# Patient Record
Sex: Female | Born: 1970 | Race: White | Hispanic: No | Smoking: Never smoker
Health system: Southern US, Community
[De-identification: ages and names within clinical notes are randomized; demographics above are authoritative.]

## PROBLEM LIST (undated history)

## (undated) DIAGNOSIS — J302 Other seasonal allergic rhinitis: Secondary | ICD-10-CM

## (undated) DIAGNOSIS — J45998 Other asthma: Secondary | ICD-10-CM

## (undated) DIAGNOSIS — M5136 Other intervertebral disc degeneration, lumbar region: Secondary | ICD-10-CM

## (undated) DIAGNOSIS — M5430 Sciatica, unspecified side: Secondary | ICD-10-CM

## (undated) DIAGNOSIS — M51369 Other intervertebral disc degeneration, lumbar region without mention of lumbar back pain or lower extremity pain: Secondary | ICD-10-CM

## (undated) DIAGNOSIS — F419 Anxiety disorder, unspecified: Secondary | ICD-10-CM

## (undated) DIAGNOSIS — G4730 Sleep apnea, unspecified: Secondary | ICD-10-CM

## (undated) DIAGNOSIS — I10 Essential (primary) hypertension: Secondary | ICD-10-CM

## (undated) DIAGNOSIS — T7840XA Allergy, unspecified, initial encounter: Secondary | ICD-10-CM

## (undated) HISTORY — PX: OTHER SURGICAL HISTORY: SHX169

## (undated) HISTORY — DX: Other intervertebral disc degeneration, lumbar region without mention of lumbar back pain or lower extremity pain: M51.369

## (undated) HISTORY — DX: Other seasonal allergic rhinitis: J30.2

## (undated) HISTORY — DX: Sleep apnea, unspecified: G47.30

## (undated) HISTORY — DX: Essential (primary) hypertension: I10

## (undated) HISTORY — PX: APPENDECTOMY: SHX54

## (undated) HISTORY — PX: FRACTURE SURGERY: SHX138

## (undated) HISTORY — DX: Sciatica, unspecified side: M54.30

## (undated) HISTORY — DX: Other intervertebral disc degeneration, lumbar region: M51.36

## (undated) HISTORY — DX: Allergy, unspecified, initial encounter: T78.40XA

## (undated) HISTORY — PX: JOINT REPLACEMENT: SHX530

## (undated) HISTORY — DX: Other asthma: J45.998

---

## 2015-02-23 DIAGNOSIS — G5603 Carpal tunnel syndrome, bilateral upper limbs: Secondary | ICD-10-CM | POA: Insufficient documentation

## 2015-08-22 ENCOUNTER — Encounter: Payer: Self-pay | Admitting: Emergency Medicine

## 2015-08-22 ENCOUNTER — Emergency Department (INDEPENDENT_AMBULATORY_CARE_PROVIDER_SITE_OTHER)
Admission: EM | Admit: 2015-08-22 | Discharge: 2015-08-22 | Disposition: A | Discharge status: Home / Self Care | Payer: PRIVATE HEALTH INSURANCE | Source: Home / Self Care | Attending: Family Medicine | Admitting: Family Medicine

## 2015-08-22 DIAGNOSIS — Z5189 Encounter for other specified aftercare: Secondary | ICD-10-CM | POA: Diagnosis not present

## 2015-08-22 NOTE — ED Notes (Signed)
Had carpal tunnel surgery left wrist 2 days ago; thinks a suture came out today during drsg change.

## 2015-08-22 NOTE — Discharge Instructions (Signed)
Your surgical wound looks to be healing well.  Please follow the instructions given to you by your hand surgeon. It is best to call him Monday to see if he wants to see you before your sutures have been scheduled to be removed.  Be sure to keep wound covered to help keep it protected and clean.

## 2015-08-22 NOTE — ED Provider Notes (Signed)
CSN: [id number]     Arrival date & time 08/22/15  2011 History   First MD Initiated Contact with Patient 08/22/15 2024     Chief Complaint  Patient presents with  . Wound Check   (Consider location/radiation/quality/duration/timing/severity/associated sxs/prior Treatment) HPI  Debra MorseMary Clodfelter is a 45 y.o. female presenting to UC with concern for removing a suture after taking off her bandages from carpal tunnel surgery 2 days ago on her Left wrist. She was advised to take the bandages off today and when she did, she noticed there was, what seemed to be, a lot of blood on the gauze. She is not sure how many sutures were in place to begin with.  Denies new injuries. Pain is minimal. Bleeding controlled PTA.  She is not on antibiotics. She is not on blood thinners. She believes she is to f/u on 6/26 for suture removal.  The surgeon's office was closed by the time she took the bandage off and noticed the bleeding.    History reviewed. No pertinent past medical history. Past Surgical History  Procedure Laterality Date  . Right foot surgery    . Right knee surgery     History reviewed. No pertinent family history. Social History  Substance Use Topics  . Smoking status: Never Smoker   . Smokeless tobacco: None  . Alcohol Use: Yes   OB History    No data available     Review of Systems  Musculoskeletal: Positive for arthralgias ( mild hand soreness). Negative for myalgias and joint swelling.  Skin: Positive for wound ( surgical, Left wrist/hand). Negative for color change.  Neurological: Negative for weakness and numbness.    Allergies  Review of patient's allergies indicates not on file.  Home Medications   Prior to Admission medications   Medication Sig Start Date End Date Taking? Authorizing Provider  escitalopram (LEXAPRO) 10 MG tablet Take 10 mg by mouth daily.   Yes Historical Provider, MD   Meds Ordered and Administered this Visit  Medications - No data to display  BP  152/85 mmHg  Pulse 68  Temp(Src) 97.9 F (36.6 C) (Oral)  Resp 16  Ht 5\' 10"  (1.778 m)  Wt 313 lb (141.976 kg)  BMI 44.91 kg/m2  SpO2 97%  LMP 08/04/2015 No data found.   Physical Exam  Constitutional: She is oriented to person, place, and time. She appears well-developed and well-nourished.  HENT:  Head: Normocephalic and atraumatic.  Eyes: EOM are normal.  Neck: Normal range of motion.  Cardiovascular: Normal rate.   Pulses:      Radial pulses are 2+ on the left side.  Left hand: cap refill < 3 seconds  Pulmonary/Chest: Effort normal.  Musculoskeletal: She exhibits edema and tenderness.  Left hand (see skin exam): mild edema around sutures where skin is pulled tight together. No tenderness. Limited ROM of hand due to tightness of skin and soreness. Pt concerned of pulling out sutures.  Neurological: She is alert and oriented to person, place, and time.  Skin: Skin is warm and dry.  Left hand: 2 sutures in place c/w carpal tunnel surgery. Small skin avulsion on distal aspect of incision (area pt concerned suture is missing) no deep wounds. No active bleeding. No erythema or warmth. No discharge.  Psychiatric: She has a normal mood and affect. Her behavior is normal.  Nursing note and vitals reviewed.   ED Course  Procedures (including critical care time)  Labs Review Labs Reviewed - No data to display  Imaging Review No results found.    MDM   1. Visit for wound check    Surgical wound appears to be healing well w/o signs of infection. Reassured pt it does not look as if any sutures are missing. Wound cleaned of dried blood. Bacitracin and light bandage applied. Encouraged to call surgeon on Monday to see if she needs to go in for a wound recheck prior to sutures being removed on 6/26.  Patient verbalized understanding and agreement with treatment plan.    Junius Finner, PA-C 08/22/15 2120

## 2015-10-31 DIAGNOSIS — Z9889 Other specified postprocedural states: Secondary | ICD-10-CM | POA: Insufficient documentation

## 2015-11-27 DIAGNOSIS — M2142 Flat foot [pes planus] (acquired), left foot: Secondary | ICD-10-CM | POA: Insufficient documentation

## 2016-01-22 DIAGNOSIS — M79672 Pain in left foot: Secondary | ICD-10-CM | POA: Insufficient documentation

## 2016-01-22 DIAGNOSIS — M19079 Primary osteoarthritis, unspecified ankle and foot: Secondary | ICD-10-CM | POA: Insufficient documentation

## 2017-07-04 DIAGNOSIS — G8929 Other chronic pain: Secondary | ICD-10-CM | POA: Insufficient documentation

## 2017-08-05 DIAGNOSIS — R6889 Other general symptoms and signs: Secondary | ICD-10-CM | POA: Insufficient documentation

## 2017-08-19 DIAGNOSIS — M1711 Unilateral primary osteoarthritis, right knee: Secondary | ICD-10-CM | POA: Insufficient documentation

## 2018-01-27 DIAGNOSIS — M1712 Unilateral primary osteoarthritis, left knee: Secondary | ICD-10-CM | POA: Insufficient documentation

## 2018-09-11 DIAGNOSIS — J45909 Unspecified asthma, uncomplicated: Secondary | ICD-10-CM | POA: Insufficient documentation

## 2018-09-12 DIAGNOSIS — M47816 Spondylosis without myelopathy or radiculopathy, lumbar region: Secondary | ICD-10-CM | POA: Insufficient documentation

## 2018-09-12 DIAGNOSIS — M5136 Other intervertebral disc degeneration, lumbar region: Secondary | ICD-10-CM | POA: Insufficient documentation

## 2019-06-20 ENCOUNTER — Encounter: Payer: Self-pay | Admitting: Emergency Medicine

## 2019-06-20 ENCOUNTER — Emergency Department (INDEPENDENT_AMBULATORY_CARE_PROVIDER_SITE_OTHER)
Admission: EM | Admit: 2019-06-20 | Discharge: 2019-06-20 | Disposition: A | Discharge status: Home / Self Care | Payer: PRIVATE HEALTH INSURANCE | Source: Home / Self Care | Attending: Family Medicine | Admitting: Family Medicine

## 2019-06-20 ENCOUNTER — Emergency Department (INDEPENDENT_AMBULATORY_CARE_PROVIDER_SITE_OTHER): Payer: PRIVATE HEALTH INSURANCE

## 2019-06-20 ENCOUNTER — Other Ambulatory Visit: Payer: Self-pay

## 2019-06-20 DIAGNOSIS — S6000XA Contusion of unspecified finger without damage to nail, initial encounter: Secondary | ICD-10-CM | POA: Diagnosis not present

## 2019-06-20 DIAGNOSIS — S62663A Nondisplaced fracture of distal phalanx of left middle finger, initial encounter for closed fracture: Secondary | ICD-10-CM

## 2019-06-20 DIAGNOSIS — S6010XA Contusion of unspecified finger with damage to nail, initial encounter: Secondary | ICD-10-CM

## 2019-06-20 NOTE — ED Triage Notes (Addendum)
Caught left middle finger in a cart at 0845 Blackened nail -bruising noted w/ swelling Pt has been icing

## 2019-06-20 NOTE — ED Provider Notes (Signed)
[name]    CSN: [id number] Arrival date & time: 06/20/19  1622      History   Chief Complaint Chief Complaint  Patient presents with  . Hand Injury    middle left finger    HPI Debra Nolan is a 49 y.o. female.   Patient caught her left middle finger in a cart about 9 hours ago resulting in pain of the tip of her finger.      Hand Pain This is a new problem. The current episode started 6 to 12 hours ago. The problem occurs constantly. The problem has not changed since onset.Exacerbated by: contact. Nothing relieves the symptoms. Treatments tried: ice packs. The treatment provided mild relief.    History reviewed. No pertinent past medical history.  There are no problems to display for this patient.   Past Surgical History:  Procedure Laterality Date  . right foot surgery    . right knee surgery      OB History   No obstetric history on file.      Home Medications    Prior to Admission medications   Medication Sig Start Date End Date Taking? Authorizing Provider  azelastine (ASTELIN) 0.1 % nasal spray Place into the nose. 09/23/15  Yes [provider]  citalopram (CELEXA) 20 MG tablet Take 20 mg by mouth daily. 06/14/19  Yes [provider]  gabapentin (NEURONTIN) 600 MG tablet Take 600 mg by mouth daily. 06/06/19  Yes [provider]  hydrochlorothiazide (HYDRODIURIL) 12.5 MG tablet Take 12.5 mg by mouth daily. 06/14/19  Yes [provider]  lisinopril (ZESTRIL) 10 MG tablet Take 10 mg by mouth daily. 06/14/19  Yes [provider]  albuterol (VENTOLIN HFA) 108 (90 Base) MCG/ACT inhaler Inhale into the lungs. 01/12/18   [provider]  escitalopram (LEXAPRO) 10 MG tablet Take 10 mg by mouth daily.    [provider]    Family History Family History  Problem Relation Age of Onset  . Healthy Mother   . Healthy Father   . Healthy Sister     Social History Social History   Tobacco  Use  . Smoking status: Never Smoker  . Smokeless tobacco: Never Used  Substance Use Topics  . Alcohol use: Yes  . Drug use: Not on file     Allergies   Patient has no known allergies.   Review of Systems Review of Systems  Skin: Positive for color change.  All other systems reviewed and are negative.    Physical Exam Triage Vital Signs ED Triage Vitals  Enc Vitals Group     BP 06/20/19 1659 (!) 155/88     Pulse Rate 06/20/19 1659 83     Resp 06/20/19 1659 16     Temp 06/20/19 1659 97.9 F (36.6 C)     Temp Source 06/20/19 1659 Oral     SpO2 06/20/19 1659 97 %     Weight 06/20/19 1705 (!) 325 lb (147.4 kg)     Height 06/20/19 1705 5\' 10"  (1.778 m)     Head Circumference --      Peak Flow --      Pain Score 06/20/19 1704 8     Pain Loc --      Pain Edu? --      Excl. in [place]? --    No data found.  Updated Vital Signs BP (!) 155/88 (BP Location: Right Arm)   Pulse 83   Temp 97.9 F (36.6  C) (Oral)   Resp 16   Ht 5\' 10"  (1.778 m)   Wt (!) 147.4 kg   LMP 06/10/2019 (Exact Date)   SpO2 97%   BMI 46.63 kg/m   Visual Acuity Right Eye Distance:   Left Eye Distance:   Bilateral Distance:    Right Eye Near:   Left Eye Near:    Bilateral Near:     Physical Exam Vitals and nursing note reviewed.  Constitutional:      General: She is not in acute distress. HENT:     Head: Atraumatic.  Eyes:     Pupils: Pupils are equal, round, and reactive to light.  Cardiovascular:     Rate and Rhythm: Normal rate.  Pulmonary:     Effort: Pulmonary effort is normal.  Musculoskeletal:     Left hand: Tenderness and bony tenderness present. There is no disruption of two-point discrimination.       Hands:     Comments: Distal phalanx of left third finger is tender to palpation with subungual hematoma present.  No deformity or laceration present.  Skin:    General: Skin is warm and dry.  Neurological:     Mental Status: She is alert.      UC Treatments / Results   Labs (all labs ordered are listed, but only abnormal results are displayed) Labs Reviewed - No data to display  EKG   Radiology DG Hand Complete Left  Result Date: 06/20/2019 CLINICAL DATA:  Left hand injury bruising to the middle finger EXAM: LEFT HAND - COMPLETE 3+ VIEW COMPARISON:  None. FINDINGS: There is comminuted nondisplaced fracture seen of the third distal tuft. Overlying soft tissue swelling is noted. There is no evidence of arthropathy or other focal bone abnormality. IMPRESSION: Comminuted nondisplaced third distal tuft fracture with overlying soft tissue swelling. Electronically Signed   By: 06/22/2019 M.D.   On: 06/20/2019 18:26    Procedures Procedures  Drainage of sub-ungual hematoma. Discussed benefits and risks of procedure and verbal consent obtained. Cleansed with Betadine followed by normal saline. Using an electrocautery device, quickly burned a 43mm hole in base of fingernail resulting in drainage of hematoma.  Bacitracin and sterile dressing applied.  Wound precautions explained to patient.     Medications Ordered in UC Medications - No data to display  Initial Impression / Assessment and Plan / UC Course  I have reviewed the triage vital signs and the nursing notes.  Pertinent labs & imaging results that were available during my care of the patient were reviewed by me and considered in my medical decision making (see chart for details).    Splint applied. Return for worsening symptoms.  Final Clinical Impressions(s) / UC Diagnoses   Final diagnoses:  Contusion of fingertip, initial encounter  Closed nondisplaced fracture of distal phalanx of left middle finger, initial encounter  Subungual hematoma of finger of left hand, initial encounter     Discharge Instructions     Change bandage daily and apply Bacitracin ointment to fingernail.  Keep bandage clean and dry.  Return for any signs of infection (or follow-up with family doctor):  Increasing  redness, swelling, pain, heat, drainage, etc.  Wear splint as needed.       ED Prescriptions    None        0m, MD 06/25/19 [id number]

## 2019-06-20 NOTE — Discharge Instructions (Addendum)
Change bandage daily and apply Bacitracin ointment to fingernail.  Keep bandage clean and dry.  Return for any signs of infection (or follow-up with family doctor):  Increasing redness, swelling, pain, heat, drainage, etc.  Wear splint as needed.

## 2019-06-21 ENCOUNTER — Telehealth: Payer: Self-pay | Admitting: Family Medicine

## 2019-06-21 MED ORDER — HYDROCODONE-ACETAMINOPHEN 5-325 MG PO TABS: ORAL_TABLET | ORAL | 0 refills | Status: DC

## 2019-06-21 NOTE — Telephone Encounter (Signed)
Subjective:  Patient complains of pain in finger at night. PLAN: Rx for Vicodin at bedtime prn (#5, no refill).

## 2019-07-01 ENCOUNTER — Emergency Department (INDEPENDENT_AMBULATORY_CARE_PROVIDER_SITE_OTHER)
Admission: EM | Admit: 2019-07-01 | Discharge: 2019-07-01 | Disposition: A | Discharge status: Home / Self Care | Payer: PRIVATE HEALTH INSURANCE | Source: Home / Self Care | Attending: Family Medicine | Admitting: Family Medicine

## 2019-07-01 ENCOUNTER — Encounter: Payer: Self-pay | Admitting: Emergency Medicine

## 2019-07-01 ENCOUNTER — Other Ambulatory Visit: Payer: Self-pay

## 2019-07-01 DIAGNOSIS — Z5189 Encounter for other specified aftercare: Secondary | ICD-10-CM

## 2019-07-01 NOTE — ED Provider Notes (Signed)
[name]    CSN: [id number] Arrival date & time: 07/01/19  8338      History   Chief Complaint Chief Complaint  Patient presents with  . Wound Check    HPI Debra Nolan is a 49 y.o. female.   Patient returns for follow-up of left third fingertip injury.  She reports mild tenderness but no erythema, swelling, or drainage from fingernail.  The history is provided by the patient.    History reviewed. No pertinent past medical history.  There are no problems to display for this patient.   Past Surgical History:  Procedure Laterality Date  . right foot surgery    . right knee surgery      OB History   No obstetric history on file.      Home Medications    Prior to Admission medications   Medication Sig Start Date End Date Taking? Authorizing Provider  albuterol (VENTOLIN HFA) 108 (90 Base) MCG/ACT inhaler Inhale into the lungs. 01/12/18   [provider]  azelastine (ASTELIN) 0.1 % nasal spray Place into the nose. 09/23/15   [provider]  citalopram (CELEXA) 20 MG tablet Take 20 mg by mouth daily. 06/14/19   [provider]  escitalopram (LEXAPRO) 10 MG tablet Take 10 mg by mouth daily.    [provider]  gabapentin (NEURONTIN) 600 MG tablet Take 600 mg by mouth daily. 06/06/19   [provider]  hydrochlorothiazide (HYDRODIURIL) 12.5 MG tablet Take 12.5 mg by mouth daily. 06/14/19   [provider]  HYDROcodone-acetaminophen (NORCO/VICODIN) 5-325 MG tablet Take one by mouth at bedtime as needed for pain 06/21/19   Kandra Nicolas, MD  lisinopril (ZESTRIL) 10 MG tablet Take 10 mg by mouth daily. 06/14/19   [provider]    Family History Family History  Problem Relation Age of Onset  . Healthy Mother   . Healthy Father   . Healthy Sister     Social History Social History   Tobacco Use  . Smoking status: Never Smoker  . Smokeless tobacco: Never Used  Substance Use Topics  .  Alcohol use: Yes  . Drug use: Not on file     Allergies   Patient has no known allergies.   Review of Systems Review of Systems  Constitutional: Negative for chills, diaphoresis, fatigue and fever.  Musculoskeletal: Negative for joint swelling.  Skin: Negative for color change and rash.     Physical Exam Triage Vital Signs ED Triage Vitals  Enc Vitals Group     BP 07/01/19 0949 139/80     Pulse Rate 07/01/19 0949 89     Resp 07/01/19 0949 16     Temp 07/01/19 0949 98.5 F (36.9 C)     Temp Source 07/01/19 0949 Oral     SpO2 07/01/19 0949 97 %     Weight --      Height --      Head Circumference --      Peak Flow --      Pain Score 07/01/19 0950 1     Pain Loc --      Pain Edu? --      Excl. in [place]? --    No data found.  Updated Vital Signs BP 139/80 (BP Location: Right Wrist)   Pulse 89   Temp 98.5 F (36.9 C) (Oral)   Resp 16   LMP 06/10/2019 (Exact Date)   SpO2 97%   Visual Acuity Right Eye Distance:  Left Eye Distance:   Bilateral Distance:    Right Eye Near:   Left Eye Near:    Bilateral Near:     Physical Exam Vitals and nursing note reviewed.  Constitutional:      General: She is not in acute distress. Cardiovascular:     Rate and Rhythm: Normal rate.     Pulses: Normal pulses.  Musculoskeletal:     Left hand: Normal.     Comments: Left finger distal phalanx:  Minimal tenderness to palpation.  Resolving sub-ungual hematoma.  No erythema, swelling, or drainage.  Skin:    General: Skin is warm and dry.     Findings: No rash.  Neurological:     Mental Status: She is alert.      UC Treatments / Results  Labs (all labs ordered are listed, but only abnormal results are displayed) Labs Reviewed - No data to display  EKG   Radiology No results found.  Procedures Procedures (including critical care time)  Medications Ordered in UC Medications - No data to display  Initial Impression / Assessment and Plan / UC Course  I have  reviewed the triage vital signs and the nursing notes.  Pertinent labs & imaging results that were available during my care of the patient were reviewed by me and considered in my medical decision making (see chart for details).    History of fingertip fracture.  Fingertip shows no evidence cellulitis.   Final Clinical Impressions(s) / UC Diagnoses   Final diagnoses:  Visit for wound check     Discharge Instructions     Change bandage daily and apply Bacitracin ointment to fingernail.  Keep fingertip clean and dry.  Wear finger splint as needed.  Return for any signs of infection (or follow-up with family doctor):  Increasing redness, swelling, pain, heat, drainage, etc.      ED Prescriptions    None        Lattie Haw, MD 07/01/19 [phone]

## 2019-07-01 NOTE — ED Triage Notes (Signed)
Patient here for wound check middle fingertip of left hand. Wearing finger protector because still tender to touch.

## 2019-07-01 NOTE — Discharge Instructions (Addendum)
Change bandage daily and apply Bacitracin ointment to fingernail.  Keep fingertip clean and dry.  Wear finger splint as needed.  Return for any signs of infection (or follow-up with family doctor):  Increasing redness, swelling, pain, heat, drainage, etc.

## 2019-10-22 ENCOUNTER — Other Ambulatory Visit: Payer: Self-pay

## 2019-10-22 ENCOUNTER — Emergency Department (INDEPENDENT_AMBULATORY_CARE_PROVIDER_SITE_OTHER): Payer: PRIVATE HEALTH INSURANCE

## 2019-10-22 ENCOUNTER — Encounter: Payer: Self-pay | Admitting: Emergency Medicine

## 2019-10-22 ENCOUNTER — Emergency Department (INDEPENDENT_AMBULATORY_CARE_PROVIDER_SITE_OTHER)
Admission: EM | Admit: 2019-10-22 | Discharge: 2019-10-22 | Disposition: A | Discharge status: Home / Self Care | Payer: PRIVATE HEALTH INSURANCE | Source: Home / Self Care

## 2019-10-22 DIAGNOSIS — M79672 Pain in left foot: Secondary | ICD-10-CM | POA: Diagnosis not present

## 2019-10-22 NOTE — Discharge Instructions (Signed)
°  You may wear the boot for comfort. Remove to bath or when elevating and applying cool compress for comfort. You may take tylenol and motrin for pain.  Call to schedule a follow up appointment with your previously established orthopedist for ongoing treatment of your pain.

## 2019-10-22 NOTE — ED Triage Notes (Signed)
Lt foot pain x 5 days, denies injury

## 2019-10-22 NOTE — ED Provider Notes (Signed)
[facility]    CSN: [id number] Arrival date & time: 10/22/19  0816      History   Chief Complaint Chief Complaint  Patient presents with  . Foot Pain    HPI Debra Nolan is a 49 y.o. female.   HPI Debra Nolan is a 49 y.o. female presenting to UC with c/o Left foot pain along lateral aspect for about 5 days, pain is aching and sore, 6/10, worse with weightbearing.  No known injury.  She did have surgery on Right foot about 8-10 years ago and right knee.  She has been on her feet more recently. No medication taken PTA.   No past medical history on file.  There are no problems to display for this patient.   Past Surgical History:  Procedure Laterality Date  . right foot surgery    . right knee surgery      OB History   No obstetric history on file.      Home Medications    Prior to Admission medications   Medication Sig Start Date End Date Taking? Authorizing Provider  albuterol (VENTOLIN HFA) 108 (90 Base) MCG/ACT inhaler Inhale into the lungs. 01/12/18   [provider]  azelastine (ASTELIN) 0.1 % nasal spray Place into the nose. 09/23/15   [provider]  escitalopram (LEXAPRO) 10 MG tablet Take 10 mg by mouth daily.    [provider]  gabapentin (NEURONTIN) 600 MG tablet Take 600 mg by mouth daily. 06/06/19   [provider]  hydrochlorothiazide (HYDRODIURIL) 12.5 MG tablet Take 12.5 mg by mouth daily. 06/14/19   [provider]  lisinopril (ZESTRIL) 10 MG tablet Take 10 mg by mouth daily. 06/14/19   [provider]    Family History Family History  Problem Relation Age of Onset  . Healthy Mother   . Healthy Father   . Healthy Sister     Social History Social History   Tobacco Use  . Smoking status: Never Smoker  . Smokeless tobacco: Never Used  Vaping Use  . Vaping Use: Never used  Substance Use Topics  . Alcohol use: Yes  . Drug use: Not on file     Allergies   Patient has no  known allergies.   Review of Systems Review of Systems  Musculoskeletal: Positive for arthralgias, gait problem (pain in left foot) and myalgias.  Skin: Negative for color change and wound.     Physical Exam Triage Vital Signs ED Triage Vitals  Enc Vitals Group     BP 10/22/19 0837 132/88     Pulse Rate 10/22/19 0837 78     Resp 10/22/19 0837 20     Temp 10/22/19 0837 98.2 F (36.8 C)     Temp Source 10/22/19 0837 Oral     SpO2 10/22/19 0837 96 %     Weight 10/22/19 0838 (!) 324 lb (147 kg)     Height 10/22/19 0838 5\' 10"  (1.778 m)     Head Circumference --      Peak Flow --      Pain Score 10/22/19 0838 6     Pain Loc --      Pain Edu? --      Excl. in [place]? --    No data found.  Updated Vital Signs BP 132/88 (BP Location: Right Arm)   Pulse 78   Temp 98.2 F (36.8 C) (Oral)   Resp 20   Ht 5\' 10"  (1.778 m)   Wt 10/24/19)  324 lb (147 kg)   SpO2 96%   BMI 46.49 kg/m   Visual Acuity Right Eye Distance:   Left Eye Distance:   Bilateral Distance:    Right Eye Near:   Left Eye Near:    Bilateral Near:     Physical Exam Vitals and nursing note reviewed.  Constitutional:      Appearance: She is well-developed.  HENT:     Head: Normocephalic and atraumatic.  Cardiovascular:     Rate and Rhythm: Normal rate.  Pulmonary:     Effort: Pulmonary effort is normal.  Musculoskeletal:        General: Swelling and tenderness present. Normal range of motion.     Cervical back: Normal range of motion.       Feet:  Feet:     Comments: Full ROM Left ankle and toes.  Skin:    General: Skin is warm and dry.     Capillary Refill: Capillary refill takes less than 2 seconds.     Findings: No bruising or erythema.  Neurological:     Mental Status: She is alert and oriented to person, place, and time.     Sensory: No sensory deficit.  Psychiatric:        Behavior: Behavior normal.      UC Treatments / Results  Labs (all labs ordered are listed, but only abnormal  results are displayed) Labs Reviewed - No data to display  EKG   Radiology DG Foot Complete Left  Result Date: 10/22/2019 CLINICAL DATA:  Lateral foot pain since Thursday.  No injury. EXAM: LEFT FOOT - COMPLETE 3+ VIEW COMPARISON:  None. FINDINGS: Evidence of prior Lisfranc injury with lateral subluxation of the second metatarsal with respect to the middle cuneiform and prominent bony hypertrophy between the bases of the first and second metatarsals. 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 Old healed fracture of the second metatarsal shaft. Advanced TMT joint osteoarthritis. Mild pes planus. Bone mineralization is normal. Soft tissues are unremarkable. IMPRESSION: 1.  No acute osseous abnormality. 2. 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 Correlate for history of prior trauma. Neuropathic arthropathy could have a similar appearance. 3. Advanced TMT joint osteoarthritis. Electronically Signed   By: Obie Dredge M.D.   On: 10/22/2019 09:32    Procedures Procedures (including critical care time)  Medications Ordered in UC Medications - No data to display  Initial Impression / Assessment and Plan / UC Course  I have reviewed the triage vital signs and the nursing notes.  Pertinent labs & imaging results that were available during my care of the patient were reviewed by me and considered in my medical decision making (see chart for details).     Discussed imaging with pt Pt placed in short walking boot for comfort.   Encouraged to schedule f/u with previously established orthopedist for further evaluation and ongoing treatment of Left foot pain. AVS given.  Final Clinical Impressions(s) / UC Diagnoses   Final diagnoses:  Acute foot pain, left     Discharge Instructions      You may wear the boot for comfort. Remove to bath or when elevating and applying cool compress for comfort. You may take tylenol and motrin for pain.  Call to  schedule a follow up appointment with your previously established orthopedist for ongoing treatment of your pain.     ED Prescriptions    None     I have reviewed the PDMP during this encounter.  Lurene Shadow, PA-C 10/22/19 1037

## 2019-11-23 ENCOUNTER — Ambulatory Visit (INDEPENDENT_AMBULATORY_CARE_PROVIDER_SITE_OTHER): Payer: PRIVATE HEALTH INSURANCE | Admitting: Podiatry

## 2019-11-23 ENCOUNTER — Ambulatory Visit (INDEPENDENT_AMBULATORY_CARE_PROVIDER_SITE_OTHER): Payer: PRIVATE HEALTH INSURANCE

## 2019-11-23 ENCOUNTER — Other Ambulatory Visit: Payer: Self-pay

## 2019-11-23 DIAGNOSIS — S92902A Unspecified fracture of left foot, initial encounter for closed fracture: Secondary | ICD-10-CM | POA: Diagnosis not present

## 2019-11-23 DIAGNOSIS — M779 Enthesopathy, unspecified: Secondary | ICD-10-CM

## 2019-11-23 DIAGNOSIS — M19272 Secondary osteoarthritis, left ankle and foot: Secondary | ICD-10-CM | POA: Diagnosis not present

## 2019-11-23 MED ORDER — CELECOXIB 200 MG PO CAPS: 200.0000 mg | ORAL_CAPSULE | Freq: Every day | ORAL | 1 refills | Status: DC

## 2019-11-23 NOTE — Progress Notes (Signed)
Subjective:   Patient ID: Debra Nolan, female   DOB: 49 y.o.   MRN: [id number]   HPI 49 year old female presents the office today for concerns of acute pain on the left foot that started yesterday when she felt a pop on her foot. Since then she went back into a cam boot that she has been home and she has a history of a Jones fracture on the right foot. She is also has significant issues with her left foot over the last several years. This originally started 1993 with a Lisfranc injury that was not treated. She has seen several physicians for the left foot. She knows that she is to have surgery but timing is an issue for her.   Review of Systems  All other systems reviewed and are negative.  No past medical history on file.  Past Surgical History:  Procedure Laterality Date  . right foot surgery    . right knee surgery       Current Outpatient Medications:  .  beclomethasone (QVAR REDIHALER) 80 MCG/ACT inhaler, Inhale into the lungs., Disp: , Rfl:  .  celecoxib (CELEBREX) 200 MG capsule, Take 1 capsule (200 mg total) by mouth daily., Disp: 30 capsule, Rfl: 1 .  lisinopril-hydrochlorothiazide (ZESTORETIC) 10-12.5 MG tablet, lisinopril 10 mg-hydrochlorothiazide 12.5 mg tablet  Take 1 tablet every day by oral route., Disp: , Rfl:  .  potassium chloride (KLOR-CON) 10 MEQ tablet, TK 2 TS PO D, Disp: , Rfl:  .  albuterol (VENTOLIN HFA) 108 (90 Base) MCG/ACT inhaler, Inhale into the lungs., Disp: , Rfl:  .  azelastine (ASTELIN) 0.1 % nasal spray, Place into the nose., Disp: , Rfl:  .  Cholecalciferol 100 MCG (4000 UT) CAPS, Vitamin D3 100 mcg (4,000 unit) capsule  Take 1 capsule every day by oral route., Disp: , Rfl:  .  citalopram (CELEXA) 20 MG tablet, Take 20 mg by mouth daily., Disp: , Rfl:  .  escitalopram (LEXAPRO) 10 MG tablet, Take 10 mg by mouth daily., Disp: , Rfl:  .  ferrous sulfate 325 (65 FE) MG tablet, Iron (ferrous sulfate) 325 mg (65 mg iron) tablet  Take 1 tablet every day by  oral route., Disp: , Rfl:  .  fluocinonide gel (LIDEX) 0.05 %, Apply topically 2 (two) times daily., Disp: , Rfl:  .  fluticasone (FLONASE) 50 MCG/ACT nasal spray, Flonase Allergy Relief 50 mcg/actuation nasal spray,suspension  Inhale 2 sprays every day by intranasal route., Disp: , Rfl:  .  gabapentin (NEURONTIN) 600 MG tablet, Take 600 mg by mouth daily., Disp: , Rfl:  .  hydrochlorothiazide (HYDRODIURIL) 12.5 MG tablet, Take 12.5 mg by mouth daily., Disp: , Rfl:  .  lisinopril (ZESTRIL) 10 MG tablet, Take 10 mg by mouth daily., Disp: , Rfl:  .  Multiple Vitamin (THERA) TABS, Take 1 tablet by mouth every morning., Disp: , Rfl:   No Known Allergies      Objective:  Physical Exam  General: AAO x3, NAD  Dermatological: No area of skin breakdown identified today. There is no erythema or warmth of the foot.  Vascular: Dorsalis Pedis artery and Posterior Tibial artery pedal pulses are 2/4 bilateral with immedate capillary fill time. There is no pain with calf compression, swelling, warmth, erythema.   Neruologic: Grossly intact via light touch bilateral.  Musculoskeletal: Flatfoot is present with changes present the Lisfranc joint. There is widening of the foot noted. There is is tenderness along the course of the sinus tarsi. Mild chronic  edema but no increase in edema she reports. There is no erythema or warmth. Muscular strength 5/5 in all groups tested bilateral.  Gait: Unassisted, Nonantalgic.       Assessment:   Capsulitis left foot; significant arthritic changes present left foot/concern for possible Charcot    Plan:  -Treatment options discussed including all alternatives, risks, and complications -Etiology of symptoms were discussed -X-rays were obtained and reviewed with the patient. No evidence of acute fracture identified and compared to previous x-rays the joint appears to be unchanged. Await radiology report -Continue in cam boot. Was dispensed today. -Celebrex  ordered -Discussed with her surgical intervention versus conservative treatment. Is not sure when she do surgery. She denies any surgery near future with her try to get her brace her custom orthotic. I will have her follow-up in the [facility] office for this.  Vivi Barrack DPM

## 2019-12-11 ENCOUNTER — Other Ambulatory Visit: Payer: Self-pay

## 2019-12-11 ENCOUNTER — Ambulatory Visit (INDEPENDENT_AMBULATORY_CARE_PROVIDER_SITE_OTHER): Payer: PRIVATE HEALTH INSURANCE | Admitting: Podiatry

## 2019-12-11 ENCOUNTER — Ambulatory Visit: Payer: PRIVATE HEALTH INSURANCE | Admitting: Orthotics

## 2019-12-11 DIAGNOSIS — M779 Enthesopathy, unspecified: Secondary | ICD-10-CM

## 2019-12-11 DIAGNOSIS — M19272 Secondary osteoarthritis, left ankle and foot: Secondary | ICD-10-CM

## 2019-12-11 NOTE — Progress Notes (Signed)
Think we are going to put off f/o until we decide about surgery  Called her and told her that she needed to schedule a surgery consult w dr. Ardelle Anton.   She agreed.

## 2019-12-12 NOTE — Progress Notes (Signed)
Subjective: 49 year old female presents the office today for follow-up evaluation of left foot pain.  She is in a boot the majority of time she states it feels better.  She has gone without the boot just a regular shoe for short time going to the store and she states that she is not having significant pain.  She denies any increase in swelling.  She also presents today for possible inserts/bracing versus surgical intervention consultation. Denies any systemic complaints such as fevers, chills, nausea, vomiting. No acute changes since last appointment, and no other complaints at this time.   Objective: AAO x3, NAD DP/PT pulses palpable bilaterally, CRT less than 3 seconds Chronic changes to the Lisfranc joint on the left side.  There is no segment discomfort identified today and there is minimal edema.  There is no erythema or warmth.  Flexor, extensor tendons appear to be intact.  MMT 5/5.  No pain with calf compression, swelling, warmth, erythema  Assessment: Chronic Lisfranc injury left foot  Plan: -All treatment options discussed with the patient including all alternatives, risks, complications.  -At this point her pain seems to be resolved.  We discussed surgical versus conservative treatment.  She is still considering surgical intervention but trying to work on timing of work.  This is been a chronic issue for her.  I had her evaluated today by our pedorthotist as well however hold off any current inserts or bracing until she decides about the surgery. -Patient encouraged to call the office with any questions, concerns, change in symptoms.   Vivi Barrack DPM

## 2020-01-22 ENCOUNTER — Other Ambulatory Visit: Payer: Self-pay | Admitting: Podiatry

## 2020-01-22 NOTE — Telephone Encounter (Signed)
Please advise 

## 2020-06-19 DIAGNOSIS — Z96659 Presence of unspecified artificial knee joint: Secondary | ICD-10-CM | POA: Insufficient documentation

## 2020-06-19 DIAGNOSIS — I10 Essential (primary) hypertension: Secondary | ICD-10-CM | POA: Insufficient documentation

## 2020-06-19 DIAGNOSIS — J301 Allergic rhinitis due to pollen: Secondary | ICD-10-CM | POA: Insufficient documentation

## 2020-06-19 DIAGNOSIS — F411 Generalized anxiety disorder: Secondary | ICD-10-CM | POA: Insufficient documentation

## 2020-10-25 ENCOUNTER — Other Ambulatory Visit: Payer: Self-pay

## 2020-10-25 ENCOUNTER — Emergency Department (INDEPENDENT_AMBULATORY_CARE_PROVIDER_SITE_OTHER)
Admission: EM | Admit: 2020-10-25 | Discharge: 2020-10-25 | Disposition: A | Discharge status: Home / Self Care | Payer: No Typology Code available for payment source | Source: Home / Self Care | Attending: Family Medicine | Admitting: Family Medicine

## 2020-10-25 ENCOUNTER — Encounter: Payer: Self-pay | Admitting: Emergency Medicine

## 2020-10-25 DIAGNOSIS — L237 Allergic contact dermatitis due to plants, except food: Secondary | ICD-10-CM | POA: Diagnosis not present

## 2020-10-25 MED ORDER — PREDNISONE 20 MG PO TABS: 20.0000 mg | ORAL_TABLET | Freq: Two times a day (BID) | ORAL | 0 refills | Status: DC

## 2020-10-25 NOTE — Discharge Instructions (Addendum)
Keep cool.  Heat will make the rash itch more Take antihistamines for the itching, Alavert and Benadryl is fine Take prednisone 2 times a day for 5 days. May use a cortisone cream on the rash if desired Should see improvement over the next couple days

## 2020-10-25 NOTE — ED Provider Notes (Signed)
[facility]    CSN: [id number] Arrival date & time: 10/25/20  1112      History   Chief Complaint Chief Complaint  Patient presents with   Rash    HPI Debra Nolan is a 50 y.o. female.   HPI  Patient has poison ivy on her left arm and left leg.  Now she has some starting on her right arm and right leg.  Its been progressing over several days.  It itches so bad that her skin is bruised from rubbing she also gets a significant rash to poison ivy.  She has been using antihistamines.  History reviewed. No pertinent past medical history.  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      Home Medications    Prior to Admission medications   Medication Sig Start Date End Date Taking? Authorizing Provider  albuterol (VENTOLIN HFA) 108 (90 Base) MCG/ACT inhaler Inhale into the lungs. 01/12/18  Yes [provider]  azelastine (ASTELIN) 0.1 % nasal spray Place into the nose. 09/23/15  Yes [provider]  beclomethasone (QVAR REDIHALER) 80 MCG/ACT inhaler Inhale into the lungs. 01/13/18  Yes [provider]  celecoxib (CELEBREX) 200 MG capsule TAKE 1 CAPSULE(200 MG) BY MOUTH DAILY 01/22/20  Yes Vivi Barrack, DPM  citalopram (CELEXA) 20 MG tablet Take 20 mg by mouth daily. 11/02/19  Yes [provider]  escitalopram (LEXAPRO) 10 MG tablet Take 10 mg by mouth daily.   Yes [provider]  fluticasone (FLONASE) 50 MCG/ACT nasal spray Flonase Allergy Relief 50 mcg/actuation nasal spray,suspension  Inhale 2 sprays every day by intranasal route.   Yes [provider]  gabapentin (NEURONTIN) 600 MG tablet Take 600 mg by mouth daily. 06/06/19  Yes [provider]  hydrochlorothiazide (HYDRODIURIL) 12.5 MG tablet Take 12.5 mg by mouth daily. 06/14/19  Yes [provider]  lisinopril (ZESTRIL) 10 MG tablet Take 10 mg by mouth daily. 06/14/19  Yes [provider]  Multiple Vitamin (THERA) TABS Take 1 tablet by mouth every morning.   Yes [provider]  predniSONE (DELTASONE) 20 MG tablet Take 1 tablet (20 mg total) by mouth 2 (two) times daily with a meal. 10/25/20  Yes Eustace Moore, MD    Family History Family History  Problem Relation Age of Onset   Healthy Mother    Healthy Father    Healthy Sister     Social History Social History   Tobacco Use   Smoking status: Never   Smokeless tobacco: Never  Vaping Use   Vaping Use: Never used  Substance Use Topics   Alcohol use: Yes     Allergies   Patient has no known allergies.   Review of Systems Review of Systems See HPI  Physical Exam Triage Vital Signs ED Triage Vitals  Enc Vitals Group     BP 10/25/20 1158 113/73     Pulse Rate 10/25/20 1158 62     Resp 10/25/20 1158 18     Temp 10/25/20 1158 98.3 F (36.8 C)     Temp Source 10/25/20 1158 Oral     SpO2 10/25/20 1158 98 %  Weight 10/25/20 1201 (!) 314 lb (142.4 kg)     Height 10/25/20 1201 5\' 10"  (1.778 m)     Head Circumference --      Peak Flow --      Pain Score 10/25/20 1201 0     Pain Loc --      Pain Edu? --      Excl. in GC? --    No data found.  Updated Vital Signs BP 113/73 (BP Location: Left Arm)   Pulse 62   Temp 98.3 F (36.8 C) (Oral)   Resp 18   Ht 5\' 10"  (1.778 m)   Wt (!) 142.4 kg   LMP 10/21/2020   SpO2 98%   BMI 45.05 kg/m      Physical Exam Constitutional:      General: She is not in acute distress.    Appearance: She is well-developed. She is obese.  HENT:     Head: Normocephalic and atraumatic.     Nose:     Comments: Mask is in place Eyes:     Conjunctiva/sclera: Conjunctivae normal.     Pupils: Pupils are equal, round, and  reactive to light.  Cardiovascular:     Rate and Rhythm: Normal rate.  Pulmonary:     Effort: Pulmonary effort is normal. No respiratory distress.  Abdominal:     General: There is no distension.     Palpations: Abdomen is soft.  Musculoskeletal:        General: Normal range of motion.     Cervical back: Normal range of motion.     Comments: Both knees replaced  Skin:    General: Skin is warm and dry.     Comments: Linear areas of rash on leg and arm with some soft tissue swelling, vesiculation, crusting, and bruising noted.  Scattered  Neurological:     Mental Status: She is alert.     UC Treatments / Results  Labs (all labs ordered are listed, but only abnormal results are displayed) Labs Reviewed - No data to display  EKG   Radiology No results found.  Procedures Procedures (including critical care time)  Medications Ordered in UC Medications - No data to display  Initial Impression / Assessment and Plan / UC Course  I have reviewed the triage vital signs and the nursing notes.  Pertinent labs & imaging results that were available during my care of the patient were reviewed by me and considered in my medical decision making (see chart for details).     Discussed poison ivy.  Prevention.  Treatment Final Clinical Impressions(s) / UC Diagnoses   Final diagnoses:  Allergic contact dermatitis due to plants, except food     Discharge Instructions      Keep cool.  Heat will make the rash itch more Take antihistamines for the itching, Alavert and Benadryl is fine Take prednisone 2 times a day for 5 days. May use a cortisone cream on the rash if desired Should see improvement over the next couple days   ED Prescriptions     Medication Sig Dispense Auth. Provider   predniSONE (DELTASONE) 20 MG tablet Take 1 tablet (20 mg total) by mouth 2 (two) times daily with a meal. 10 tablet , MD      PDMP not reviewed this encounter.   10/23/2020, MD 10/25/20 1245

## 2020-10-25 NOTE — ED Triage Notes (Signed)
Patient c/o rash on left leg near her knee and arm since Monday.  The rash is very itchy and red.  Patient has tried an antiseptic wash, Dial soap, Bactin, Bacitracin ointment, Cerave lotion.

## 2020-12-22 ENCOUNTER — Other Ambulatory Visit: Payer: Self-pay | Admitting: Podiatry

## 2021-04-23 ENCOUNTER — Emergency Department (INDEPENDENT_AMBULATORY_CARE_PROVIDER_SITE_OTHER)
Admission: EM | Admit: 2021-04-23 | Discharge: 2021-04-23 | Disposition: A | Discharge status: Home / Self Care | Payer: BC Managed Care – PPO | Source: Home / Self Care

## 2021-04-23 ENCOUNTER — Other Ambulatory Visit: Payer: Self-pay

## 2021-04-23 DIAGNOSIS — R0981 Nasal congestion: Secondary | ICD-10-CM

## 2021-04-23 MED ORDER — PREDNISONE 20 MG PO TABS: ORAL_TABLET | ORAL | 0 refills | Status: DC

## 2021-04-23 NOTE — ED Triage Notes (Signed)
Pt here today c/o of sinus issues. Said went to bed with scratchy throat then woke up with with nasal congestion and RT ear "fullness". Coughing and blowing out yellow mucous. Tylenol prn.

## 2021-04-23 NOTE — Discharge Instructions (Addendum)
Advised patient to take medication as directed with food to completion.  Encouraged patient to increase daily water intake while taking this medication. 

## 2021-04-23 NOTE — ED Provider Notes (Signed)
[facility]    CSN: [id number] Arrival date & time: 04/23/21  1741      History   Chief Complaint Chief Complaint  Patient presents with   Sinus issues    HPI Debra Nolan is a 51 y.o. female.   HPI 51 year old female presents with sinus nasal congestion for 1 day.  Reports going to bed last night and then waking up with nasal congestion today including right ear fullness.  Additionally patient reports coughing and blowing out yellow mucus.  History reviewed. No pertinent past medical history.  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      Home Medications    Prior to Admission medications   Medication Sig Start Date End Date Taking? Authorizing Provider  predniSONE (DELTASONE) 20 MG tablet Take 3 tabs PO daily x 5 days. 04/23/21  Yes Trevor Iha, FNP  albuterol (VENTOLIN HFA) 108 (90 Base) MCG/ACT inhaler Inhale into the lungs. 01/12/18   [provider]  azelastine (ASTELIN) 0.1 % nasal spray Place into the nose. 09/23/15   [provider]  beclomethasone (QVAR REDIHALER) 80 MCG/ACT inhaler Inhale into the lungs. 01/13/18   [provider]  celecoxib (CELEBREX) 200 MG capsule TAKE 1 CAPSULE(200 MG) BY MOUTH DAILY 01/22/20   Vivi Barrack, DPM  citalopram (CELEXA) 20 MG tablet Take 20 mg by mouth daily. 11/02/19   [provider]  escitalopram (LEXAPRO) 10 MG tablet Take 10 mg by mouth daily.    [provider]  fluticasone (FLONASE) 50 MCG/ACT nasal spray Flonase Allergy Relief 50 mcg/actuation nasal spray,suspension  Inhale 2 sprays every day by intranasal route.    [provider]  gabapentin (NEURONTIN) 600 MG tablet Take 600 mg by mouth  daily. 06/06/19   [provider]  hydrochlorothiazide (HYDRODIURIL) 12.5 MG tablet Take 12.5 mg by mouth daily. 06/14/19   [provider]  lisinopril (ZESTRIL) 10 MG tablet Take 10 mg by mouth daily. 06/14/19   [provider]  Multiple Vitamin (THERA) TABS Take 1 tablet by mouth every morning.    [provider]  phentermine (ADIPEX-P) 37.5 MG tablet Take by mouth. 04/19/21   [provider]    Family History Family History  Problem Relation Age of Onset   Healthy Mother    Healthy Father    Healthy Sister     Social History Social History   Tobacco Use   Smoking status: Never   Smokeless tobacco: Never  Vaping Use   Vaping Use: Never used  Substance Use Topics   Alcohol use: Yes     Allergies   Patient has no known allergies.   Review of Systems Review of Systems  HENT:  Positive for congestion and sore throat.        Right ear fullness x1 day  All other systems reviewed and are negative.   Physical Exam Triage Vital Signs ED Triage Vitals  Enc Vitals Group     BP 04/23/21 1750 134/85     Pulse Rate 04/23/21 1750 74     Resp 04/23/21 1750 17     Temp 04/23/21 1750 97.8 F (36.6 C)  Temp Source 04/23/21 1750 Oral     SpO2 04/23/21 1750 99 %     Weight --      Height --      Head Circumference --      Peak Flow --      Pain Score 04/23/21 1752 0     Pain Loc --      Pain Edu? --      Excl. in GC? --    No data found.  Updated Vital Signs BP 134/85 (BP Location: Right Arm)    Pulse 74    Temp 97.8 F (36.6 C) (Oral)    Resp 17    SpO2 99%      Physical Exam Vitals and nursing note reviewed.  Constitutional:      General: She is not in acute distress.    Appearance: Normal appearance. She is obese. She is not ill-appearing.  HENT:     Head: Normocephalic and atraumatic.     Right Ear: Tympanic membrane, ear canal and external ear normal.     Left Ear: Tympanic membrane, ear canal and external ear  normal.     Nose:     Right Sinus: Maxillary sinus tenderness present.     Left Sinus: Maxillary sinus tenderness present.     Mouth/Throat:     Mouth: Mucous membranes are moist.     Pharynx: Oropharynx is clear.  Eyes:     Extraocular Movements: Extraocular movements intact.     Conjunctiva/sclera: Conjunctivae normal.     Pupils: Pupils are equal, round, and reactive to light.  Cardiovascular:     Rate and Rhythm: Normal rate and regular rhythm.     Pulses: Normal pulses.     Heart sounds: Normal heart sounds.  Pulmonary:     Effort: Pulmonary effort is normal.     Breath sounds: Normal breath sounds.  Musculoskeletal:     Cervical back: Normal range of motion and neck supple.  Skin:    General: Skin is warm and dry.  Neurological:     General: No focal deficit present.     Mental Status: She is alert and oriented to person, place, and time.     UC Treatments / Results  Labs (all labs ordered are listed, but only abnormal results are displayed) Labs Reviewed - No data to display  EKG   Radiology No results found.  Procedures Procedures (including critical care time)  Medications Ordered in UC Medications - No data to display  Initial Impression / Assessment and Plan / UC Course  I have reviewed the triage vital signs and the nursing notes.  Pertinent labs & imaging results that were available during my care of the patient were reviewed by me and considered in my medical decision making (see chart for details).     MDM: 1.  Sinus congestion-Rx'd prednisone. Advised patient to take medication as directed with food to completion.  Encouraged patient to increase daily water intake while taking this medication.  Patient discharged home, hemodynamically stable. Final Clinical Impressions(s) / UC Diagnoses   Final diagnoses:  Nasal sinus congestion     Discharge Instructions      Advised patient to take medication as directed with food to completion.   Encouraged patient to increase daily water intake while taking this medication.     ED Prescriptions     Medication Sig Dispense Auth. Provider   predniSONE (DELTASONE) 20 MG tablet Take 3 tabs PO daily x 5 days.  15 tablet Trevor Iha, FNP      PDMP not reviewed this encounter.   Trevor Iha, FNP 04/23/21 1812

## 2021-04-28 ENCOUNTER — Emergency Department (INDEPENDENT_AMBULATORY_CARE_PROVIDER_SITE_OTHER)
Admission: EM | Admit: 2021-04-28 | Discharge: 2021-04-28 | Disposition: A | Discharge status: Home / Self Care | Payer: BC Managed Care – PPO | Source: Home / Self Care

## 2021-04-28 ENCOUNTER — Other Ambulatory Visit: Payer: Self-pay

## 2021-04-28 DIAGNOSIS — J0140 Acute pansinusitis, unspecified: Secondary | ICD-10-CM | POA: Diagnosis not present

## 2021-04-28 MED ORDER — AMOXICILLIN-POT CLAVULANATE 875-125 MG PO TABS: 1.0000 | ORAL_TABLET | Freq: Two times a day (BID) | ORAL | 0 refills | Status: DC

## 2021-04-28 NOTE — ED Triage Notes (Signed)
Pt c/o cough and congestion since last Wed. Seen at [facility] on 2/16. Rx'd prednisone, one day left. Cough starting to get worse. Dry, no non productive. Denies fever. Cough drops and tylenol prn.

## 2021-04-28 NOTE — ED Provider Notes (Signed)
[facility]    CSN: [id number] Arrival date & time: 04/28/21  1641      History   Chief Complaint Chief Complaint  Patient presents with   Nasal Congestion   Cough    HPI Debra Nolan is a 51 y.o. female.   Patient presents today with a weeklong history of URI symptoms.  Reports nasal congestion as well as cough.  Reports that symptoms have worsened and she is now experiencing thick purulent sputum with worsening cough prompting reevaluation.  She was seen 04/23/2021 at which point symptoms were attributed to URI and she was treated with prednisone.  This has been ineffective.  She does have a history of allergies and has been taking Alavert without improvement of symptoms.  She does have a history of asthma but has not required albuterol inhaler.  Denies any recent antibiotic use.  She has not been taking anything over-the-counter for symptom management.  She is up-to-date on influenza and COVID-19 vaccines.  She did take an at-home COVID test that was negative when symptoms began.   History reviewed. No pertinent past medical history.  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      Home Medications    Prior to Admission medications   Medication Sig Start Date End Date Taking? Authorizing Provider  amoxicillin-clavulanate (AUGMENTIN) 875-125 MG tablet Take 1 tablet by mouth every 12 (twelve) hours. 04/28/21  Yes Emberley Kral K, PA-C  albuterol (VENTOLIN HFA) 108 (90 Base) MCG/ACT inhaler Inhale into the lungs. 01/12/18   [provider]  azelastine (ASTELIN) 0.1 % nasal spray Place into the nose. 09/23/15   [provider]  beclomethasone (QVAR REDIHALER) 80 MCG/ACT inhaler Inhale into the  lungs. 01/13/18   [provider]  celecoxib (CELEBREX) 200 MG capsule TAKE 1 CAPSULE(200 MG) BY MOUTH DAILY 01/22/20   Vivi Barrack, DPM  citalopram (CELEXA) 20 MG tablet Take 20 mg by mouth daily. 11/02/19   [provider]  escitalopram (LEXAPRO) 10 MG tablet Take 10 mg by mouth daily.    [provider]  fluticasone (FLONASE) 50 MCG/ACT nasal spray Flonase Allergy Relief 50 mcg/actuation nasal spray,suspension  Inhale 2 sprays every day by intranasal route.    [provider]  gabapentin (NEURONTIN) 600 MG tablet Take 600 mg by mouth daily. 06/06/19   [provider]  hydrochlorothiazide (HYDRODIURIL) 12.5 MG tablet Take 12.5 mg by mouth daily. 06/14/19   [provider]  lisinopril (ZESTRIL) 10 MG tablet Take 10 mg by mouth daily. 06/14/19   [provider]  Multiple Vitamin (THERA) TABS Take 1 tablet by mouth every morning.    [provider]  phentermine (ADIPEX-P) 37.5 MG tablet Take by mouth. 04/19/21   [provider]  predniSONE (DELTASONE) 20 MG tablet Take 3 tabs PO daily x 5 days. 04/23/21   Trevor Iha, FNP    Family History Family History  Problem Relation Age of Onset   Healthy Mother    Healthy Father    Healthy Sister     Social History Social History   Tobacco Use   Smoking status: Never   Smokeless tobacco: Never  Vaping Use   Vaping Use: Never used  Substance Use Topics   Alcohol use: Yes     Allergies   Patient has no known allergies.   Review of Systems Review of Systems  Constitutional:  Positive for activity change. Negative for appetite change, fatigue and fever.  HENT:  Positive for congestion and sinus pressure. Negative for sneezing and sore throat.   Respiratory:  Positive for cough. Negative for shortness of breath.   Cardiovascular:  Negative for chest pain.  Gastrointestinal:  Negative for abdominal pain, diarrhea, nausea and vomiting.  Musculoskeletal:   Negative for arthralgias and myalgias.  Neurological:  Negative for dizziness, light-headedness and headaches.    Physical Exam Triage Vital Signs ED Triage Vitals  Enc Vitals Group     BP 04/28/21 1655 (!) 148/92     Pulse Rate 04/28/21 1655 69     Resp 04/28/21 1655 17     Temp 04/28/21 1655 98.3 F (36.8 C)     Temp Source 04/28/21 1655 Oral     SpO2 04/28/21 1655 98 %     Weight --      Height --      Head Circumference --      Peak Flow --      Pain Score 04/28/21 1656 0     Pain Loc --      Pain Edu? --      Excl. in GC? --    No data found.  Updated Vital Signs BP (!) 148/92 (BP Location: Right Arm)    Pulse 69    Temp 98.3 F (36.8 C) (Oral)    Resp 17    SpO2 98%   Visual Acuity Right Eye Distance:   Left Eye Distance:   Bilateral Distance:    Right Eye Near:   Left Eye Near:    Bilateral Near:     Physical Exam Vitals reviewed.  Constitutional:      General: She is awake. She is not in acute distress.    Appearance: Normal appearance. She is well-developed. She is not ill-appearing.     Comments: Very pleasant female appears stated age in no acute distress sitting comfortably in exam room  HENT:     Head: Normocephalic and atraumatic.     Right Ear: Tympanic membrane, ear canal and external ear normal. Tympanic membrane is not erythematous or bulging.     Left Ear: Tympanic membrane, ear canal and external ear normal. Tympanic membrane is not erythematous or bulging.     Nose:     Right Sinus: Maxillary sinus tenderness present. No frontal sinus tenderness.     Left Sinus: Maxillary sinus tenderness present. No frontal sinus tenderness.     Mouth/Throat:     Pharynx: Uvula midline. Posterior oropharyngeal erythema present. No oropharyngeal exudate.     Comments: Erythema and drainage in posterior oropharynx Cardiovascular:     Rate and Rhythm: Normal rate and regular rhythm.     Heart sounds: Normal heart sounds, S1 normal and S2 normal. No murmur  heard. Pulmonary:     Effort: Pulmonary effort is normal.     Breath sounds: Normal breath sounds. No wheezing, rhonchi or rales.     Comments: Clear to auscultation bilaterally Psychiatric:        Behavior: Behavior is cooperative.     UC Treatments / Results  Labs (all labs ordered are listed, but only abnormal results are displayed) Labs Reviewed - No data to display  EKG  Radiology No results found.  Procedures Procedures (including critical care time)  Medications Ordered in UC Medications - No data to display  Initial Impression / Assessment and Plan / UC Course  I have reviewed the triage vital signs and the nursing notes.  Pertinent labs & imaging results that were available during my care of the patient were reviewed by me and considered in my medical decision making (see chart for details).     No indication for viral testing given patient has been symptomatic for a week and this would not change management.  Discussed that it is possible symptoms are related to postviral syndrome versus ongoing virus but given recent significant worsening will empirically treat with Augmentin twice daily.  She was encouraged to use over-the-counter medication including sinus rinses, Flonase, Mucinex, Tylenol for symptom relief.  She is to rest and drink plenty of fluid.  Discussed that if symptoms or not improving within a week she should return here or see her PCP.  Discussed that if she develops any worsening symptoms including high fever, worsening cough, chest pain, shortness of breath, nausea/vomiting interfering with oral intake she should be seen immediately.  Final Clinical Impressions(s) / UC Diagnoses   Final diagnoses:  Acute non-recurrent pansinusitis     Discharge Instructions      Start Augmentin twice daily.  This should cover for sinus/bronchitis infection.  Continue over-the-counter medication including Mucinex, Flonase, nasal saline, sinus rinses for symptom  relief.  Make sure you rest and drink plenty of fluid.  If your symptoms or not improving by next week please return here or see your PCP.  If anything worsens and you develop high fever, chest pain, shortness of breath, worsening cough, nausea/vomiting interfering with oral intake you should be seen immediately.     ED Prescriptions     Medication Sig Dispense Auth. Provider   amoxicillin-clavulanate (AUGMENTIN) 875-125 MG tablet Take 1 tablet by mouth every 12 (twelve) hours. 14 tablet Saladin Petrelli, Noberto Retort, PA-C      PDMP not reviewed this encounter.   Jeani Hawking, PA-C 04/28/21 1709

## 2021-04-28 NOTE — Discharge Instructions (Signed)
Start Augmentin twice daily.  This should cover for sinus/bronchitis infection.  Continue over-the-counter medication including Mucinex, Flonase, nasal saline, sinus rinses for symptom relief.  Make sure you rest and drink plenty of fluid.  If your symptoms or not improving by next week please return here or see your PCP.  If anything worsens and you develop high fever, chest pain, shortness of breath, worsening cough, nausea/vomiting interfering with oral intake you should be seen immediately.

## 2021-05-21 LAB — COLOGUARD: Cologuard: NEGATIVE

## 2021-05-28 LAB — EXTERNAL GENERIC LAB PROCEDURE: COLOGUARD: NEGATIVE

## 2021-05-28 LAB — COLOGUARD: COLOGUARD: NEGATIVE

## 2021-10-06 DIAGNOSIS — I10 Essential (primary) hypertension: Secondary | ICD-10-CM | POA: Diagnosis not present

## 2021-10-06 DIAGNOSIS — Z713 Dietary counseling and surveillance: Secondary | ICD-10-CM | POA: Diagnosis not present

## 2021-10-06 DIAGNOSIS — Z6841 Body Mass Index (BMI) 40.0 and over, adult: Secondary | ICD-10-CM | POA: Diagnosis not present

## 2021-10-09 DIAGNOSIS — Z6841 Body Mass Index (BMI) 40.0 and over, adult: Secondary | ICD-10-CM | POA: Diagnosis not present

## 2021-10-09 DIAGNOSIS — F411 Generalized anxiety disorder: Secondary | ICD-10-CM | POA: Diagnosis not present

## 2021-10-09 DIAGNOSIS — R61 Generalized hyperhidrosis: Secondary | ICD-10-CM | POA: Diagnosis not present

## 2021-10-09 DIAGNOSIS — I10 Essential (primary) hypertension: Secondary | ICD-10-CM | POA: Diagnosis not present

## 2021-10-19 ENCOUNTER — Ambulatory Visit
Admission: EM | Admit: 2021-10-19 | Discharge: 2021-10-19 | Disposition: A | Discharge status: Home / Self Care | Payer: BC Managed Care – PPO | Attending: Family Medicine | Admitting: Family Medicine

## 2021-10-19 DIAGNOSIS — R300 Dysuria: Secondary | ICD-10-CM

## 2021-10-19 DIAGNOSIS — N3091 Cystitis, unspecified with hematuria: Secondary | ICD-10-CM | POA: Diagnosis not present

## 2021-10-19 DIAGNOSIS — N3090 Cystitis, unspecified without hematuria: Secondary | ICD-10-CM

## 2021-10-19 LAB — POCT URINALYSIS DIP (MANUAL ENTRY)
Bilirubin, UA: NEGATIVE
Glucose, UA: NEGATIVE mg/dL
Ketones, POC UA: NEGATIVE mg/dL
Nitrite, UA: NEGATIVE
Protein Ur, POC: 100 mg/dL — AB
Spec Grav, UA: 1.02 (ref 1.010–1.025)
Urobilinogen, UA: 0.2 E.U./dL
pH, UA: 6 (ref 5.0–8.0)

## 2021-10-19 MED ORDER — NITROFURANTOIN MONOHYD MACRO 100 MG PO CAPS
ORAL_CAPSULE | ORAL | 0 refills | Status: DC
Start: 2021-10-19 — End: 2021-11-11

## 2021-10-19 NOTE — Discharge Instructions (Signed)
Increase fluid intake. May use non-prescription Cystex for about two days, if desired, to decrease urinary discomfort.   If symptoms become significantly worse during the night or over the weekend, proceed to the local emergency room.

## 2021-10-19 NOTE — ED Triage Notes (Signed)
Pt presents with dysuria and hematuria that began on Saturday.

## 2021-10-19 NOTE — ED Provider Notes (Signed)
[facility]    CSN: [id number] Arrival date & time: 10/19/21  4854      History   Chief Complaint Chief Complaint  Patient presents with   Dysuria    HPI Debra Nolan is a 51 y.o. female.   Patient developed lower abdominal cramps and slight hematuria two days ago.  Since then she has developed frequency and mild dysuria.  She denies fevers, chills, and sweats, abdominal/pelvic pain and back/flank pain.  She has been using Cystex.  She states that she has never had a UTI.   The history is provided by the patient.  Dysuria Pain quality:  Burning Pain severity:  Mild Onset quality:  Sudden Duration:  2 days Timing:  Constant Progression:  Worsening Chronicity:  New Recent urinary tract infections: no   Relieved by: Cystex. Worsened by:  Nothing Urinary symptoms: frequent urination, hematuria and hesitancy   Urinary symptoms: no discolored urine, no foul-smelling urine and no bladder incontinence   Associated symptoms: no abdominal pain, no fever, no flank pain, no nausea and no vomiting   Risk factors: no recurrent urinary tract infections     History reviewed. No pertinent past medical history.  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      Home Medications    Prior to Admission medications   Medication Sig Start Date End Date Taking? Authorizing Provider  lisinopril-hydrochlorothiazide (ZESTORETIC) 20-12.5 MG tablet Take 1 tablet by mouth daily.   Yes [provider]  nitrofurantoin, macrocrystal-monohydrate, (MACROBID) 100 MG capsule Take one cap PO Q12hr with food. 10/19/21  Yes Lattie Haw, MD  albuterol (VENTOLIN HFA) 108 (90 Base) MCG/ACT inhaler Inhale into the lungs.  01/12/18   [provider]  amoxicillin-clavulanate (AUGMENTIN) 875-125 MG tablet Take 1 tablet by mouth every 12 (twelve) hours. Patient not taking: Reported on 10/19/2021 04/28/21   Raspet, Denny Peon K, PA-C  azelastine (ASTELIN) 0.1 % nasal spray Place into the nose. Patient not taking: Reported on 10/19/2021 09/23/15   [provider]  beclomethasone (QVAR REDIHALER) 80 MCG/ACT inhaler Inhale into the lungs. 01/13/18   [provider]  celecoxib (CELEBREX) 200 MG capsule TAKE 1 CAPSULE(200 MG) BY MOUTH DAILY Patient not taking: Reported on 10/19/2021 01/22/20   Vivi Barrack, DPM  citalopram (CELEXA) 20 MG tablet Take 20 mg by mouth daily. Patient not taking: Reported on 10/19/2021 11/02/19   [provider]  escitalopram (LEXAPRO) 10 MG tablet Take 10 mg by mouth daily. Patient not taking: Reported on 10/19/2021    [provider]  fluticasone (FLONASE) 50 MCG/ACT nasal spray Flonase Allergy Relief 50 mcg/actuation nasal spray,suspension  Inhale 2 sprays every day by intranasal route.    [provider]  gabapentin (NEURONTIN) 600 MG tablet Take 600 mg by mouth daily. 06/06/19   [provider]  hydrochlorothiazide (HYDRODIURIL) 12.5 MG tablet Take 12.5 mg by mouth daily. Patient not taking: Reported on 10/19/2021 06/14/19   [provider]  lisinopril (ZESTRIL) 10 MG tablet Take 10 mg by mouth daily. Patient not taking: Reported on 10/19/2021 06/14/19   [provider]  Multiple Vitamin (THERA) TABS Take 1 tablet by  mouth every morning.    [provider]  phentermine (ADIPEX-P) 37.5 MG tablet Take by mouth. 04/19/21   [provider]  predniSONE (DELTASONE) 20 MG tablet Take 3 tabs PO daily x 5 days. Patient not taking: Reported on 10/19/2021 04/23/21   Eliezer Lofts, FNP    Family History Family History  Problem Relation Age of Onset   Healthy Mother    Healthy Father    Healthy Sister     Social  History Social History   Tobacco Use   Smoking status: Never   Smokeless tobacco: Never  Vaping Use   Vaping Use: Never used  Substance Use Topics   Alcohol use: Yes     Allergies   Patient has no known allergies.   Review of Systems Review of Systems  Constitutional:  Negative for appetite change, chills, diaphoresis, fatigue and fever.  Gastrointestinal:  Negative for abdominal pain, nausea and vomiting.  Genitourinary:  Positive for dysuria, frequency, hematuria and urgency. Negative for flank pain and pelvic pain.  All other systems reviewed and are negative.    Physical Exam Triage Vital Signs ED Triage Vitals  Enc Vitals Group     BP 10/19/21 0847 135/84     Pulse Rate 10/19/21 0847 90     Resp 10/19/21 0847 14     Temp 10/19/21 0847 98.4 F (36.9 C)     Temp Source 10/19/21 0847 Oral     SpO2 10/19/21 0847 97 %     Weight --      Height --      Head Circumference --      Peak Flow --      Pain Score 10/19/21 0849 0     Pain Loc --      Pain Edu? --      Excl. in [place]? --    No data found.  Updated Vital Signs BP 135/84 (BP Location: Left Arm)   Pulse 90   Temp 98.4 F (36.9 C) (Oral)   Resp 14   SpO2 97%   Visual Acuity Right Eye Distance:   Left Eye Distance:   Bilateral Distance:    Right Eye Near:   Left Eye Near:    Bilateral Near:     Physical Exam Nursing notes and Vital Signs reviewed. Appearance:  Patient appears stated age, and in no acute distress.    Eyes:  Pupils are equal, round, and reactive to light and accomodation.  Extraocular movement is intact.  Conjunctivae are not inflamed   Pharynx:  Normal; moist mucous membranes  Neck:  Supple.  No adenopathy Lungs:  Clear to auscultation.  Breath sounds are equal.  Moving air well. Heart:  Regular rate and rhythm without murmurs, rubs, or gallops.  Abdomen:  Nontender without masses or hepatosplenomegaly.  Bowel sounds are present.  No CVA or flank tenderness.  Extremities:  No  edema.  Skin:  No rash present.     UC Treatments / Results  Labs (all labs ordered are listed, but only abnormal results are displayed) Labs Reviewed  POCT URINALYSIS DIP (MANUAL ENTRY) - Abnormal; Notable for the following components:      Result Value   Color, UA straw (*)    Clarity, UA cloudy (*)    Blood, UA moderate (*)    Protein Ur, POC =100 (*)    Leukocytes, UA Small (1+) (*)    All other components within normal limits  URINE CULTURE    EKG  Radiology No results found.  Procedures Procedures (including critical care time)  Medications Ordered in UC Medications - No data to display  Initial Impression / Assessment and Plan / UC Course  I have reviewed the triage vital signs and the nursing notes.  Pertinent labs & imaging results that were available during my care of the patient were reviewed by me and considered in my medical decision making (see chart for details).    Begin Macrobid.  Urine culture pending. Followup with Family Doctor if not improved in one week.   Final Clinical Impressions(s) / UC Diagnoses   Final diagnoses:  Dysuria  Cystitis     Discharge Instructions      Increase fluid intake. May use non-prescription Cystex for about two days, if desired, to decrease urinary discomfort.   If symptoms become significantly worse during the night or over the weekend, proceed to the local emergency room.     ED Prescriptions     Medication Sig Dispense Auth. Provider   nitrofurantoin, macrocrystal-monohydrate, (MACROBID) 100 MG capsule Take one cap PO Q12hr with food. 14 capsule Lattie Haw, MD         Lattie Haw, MD 10/19/21 [phone]

## 2021-10-20 ENCOUNTER — Telehealth: Payer: Self-pay | Admitting: Emergency Medicine

## 2021-10-20 NOTE — Telephone Encounter (Signed)
LMTRC.  Advised if doing well, patient can disregard call, any questions or concerns, patient can contact the office.

## 2021-10-21 LAB — URINE CULTURE: Culture: 80000 — AB

## 2021-11-10 DIAGNOSIS — Z713 Dietary counseling and surveillance: Secondary | ICD-10-CM | POA: Diagnosis not present

## 2021-11-10 DIAGNOSIS — I10 Essential (primary) hypertension: Secondary | ICD-10-CM | POA: Diagnosis not present

## 2021-11-10 DIAGNOSIS — Z6841 Body Mass Index (BMI) 40.0 and over, adult: Secondary | ICD-10-CM | POA: Diagnosis not present

## 2021-11-11 ENCOUNTER — Ambulatory Visit
Admission: EM | Admit: 2021-11-11 | Discharge: 2021-11-11 | Disposition: A | Discharge status: Home / Self Care | Payer: BC Managed Care – PPO | Attending: Family Medicine | Admitting: Family Medicine

## 2021-11-11 DIAGNOSIS — R300 Dysuria: Secondary | ICD-10-CM

## 2021-11-11 DIAGNOSIS — N3090 Cystitis, unspecified without hematuria: Secondary | ICD-10-CM | POA: Diagnosis not present

## 2021-11-11 LAB — POCT URINALYSIS DIP (MANUAL ENTRY)
Bilirubin, UA: NEGATIVE
Glucose, UA: NEGATIVE mg/dL
Ketones, POC UA: NEGATIVE mg/dL
Nitrite, UA: POSITIVE — AB
Protein Ur, POC: 100 mg/dL — AB
Spec Grav, UA: >=1.03 — AB (ref 1.010–1.025)
Urobilinogen, UA: 0.2 E.U./dL
pH, UA: 6 (ref 5.0–8.0)

## 2021-11-11 MED ORDER — CEPHALEXIN 500 MG PO CAPS: 500.0000 mg | ORAL_CAPSULE | Freq: Two times a day (BID) | ORAL | 0 refills | Status: DC

## 2021-11-11 NOTE — ED Triage Notes (Signed)
Pt c/o dysuria x 2 days. Was here 8/14 for similar sxs. Increased fluids and drinking cranberry.

## 2021-11-11 NOTE — ED Provider Notes (Signed)
[facility]    CSN: [id number] Arrival date & time: 11/11/21  1039      History   Chief Complaint Chief Complaint  Patient presents with   Dysuria    HPI Debra Nolan is a 51 y.o. female.   Yesterday patient suddenly developed dysuria, mild hematuria, and hesitancy.  She denies pelvic/abdominal pain, fever, nausea/vomiting, and feels well otherwise.  She was treated here on 10/19/21 for UTI, and reports that those symptoms had resolved.  The history is provided by the patient.  Dysuria Pain quality:  Burning Pain severity:  Mild Onset quality:  Sudden Duration:  1 day Timing:  Constant Progression:  Worsening Chronicity:  Recurrent Recent urinary tract infections: yes   Relieved by:  None tried Worsened by:  Nothing Ineffective treatments:  None tried Urinary symptoms: hematuria and hesitancy   Urinary symptoms: no discolored urine, no foul-smelling urine, no frequent urination and no bladder incontinence   Associated symptoms: no abdominal pain, no fever, no flank pain, no nausea and no vaginal discharge     History reviewed. No pertinent past medical history.  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      Home Medications    Prior to Admission medications   Medication Sig Start Date End Date Taking? Authorizing Provider  cephALEXin (KEFLEX) 500 MG capsule Take 1 capsule (500 mg total) by mouth 2 (two) times daily. 11/11/21  Yes Lattie Haw, MD  albuterol (VENTOLIN HFA) 108 (90 Base) MCG/ACT inhaler Inhale into the lungs. 01/12/18   [provider]  azelastine (ASTELIN) 0.1 % nasal spray Place into the nose. Patient not taking: Reported on 10/19/2021 09/23/15   [provider]  beclomethasone (QVAR REDIHALER) 80 MCG/ACT inhaler Inhale into the lungs. 01/13/18   [provider]  celecoxib (CELEBREX) 200 MG capsule TAKE 1 CAPSULE(200 MG) BY MOUTH DAILY Patient not taking: Reported on 10/19/2021 01/22/20   Vivi Barrack, DPM  citalopram (CELEXA) 20 MG tablet Take 20 mg by mouth daily. Patient not taking: Reported on 10/19/2021 11/02/19   [provider]  escitalopram (LEXAPRO) 10 MG tablet Take 10 mg by mouth daily. Patient not taking: Reported on 10/19/2021    [provider]  fluticasone (FLONASE) 50 MCG/ACT nasal spray Flonase Allergy Relief 50 mcg/actuation nasal spray,suspension  Inhale 2 sprays every day by intranasal route.    [provider]  gabapentin (NEURONTIN) 600 MG tablet Take 600 mg by mouth daily. 06/06/19   [provider]  hydrochlorothiazide (HYDRODIURIL) 12.5 MG tablet Take 12.5 mg by mouth daily. Patient not taking: Reported on 10/19/2021 06/14/19   [provider]  lisinopril (ZESTRIL) 10 MG tablet Take 10 mg by mouth daily. Patient not taking: Reported on 10/19/2021 06/14/19   [provider]  lisinopril-hydrochlorothiazide (ZESTORETIC) 20-12.5 MG tablet Take 1 tablet by mouth daily.    [provider]  Multiple Vitamin (THERA) TABS Take 1 tablet by mouth every morning.    [provider]  phentermine (ADIPEX-P) 37.5 MG tablet Take by mouth. 04/19/21   [provider]  predniSONE (DELTASONE) 20 MG tablet Take 3 tabs PO daily x 5 days.  Patient not taking: Reported on 10/19/2021 04/23/21   Trevor Iha, FNP    Family History Family History  Problem Relation Age of Onset   Healthy Mother    Healthy Father    Healthy Sister     Social History Social History   Tobacco Use   Smoking status: Never   Smokeless tobacco: Never  Vaping Use   Vaping Use: Never used  Substance Use Topics   Alcohol use: Yes     Allergies   Patient has no known  allergies.   Review of Systems Review of Systems  Constitutional:  Negative for appetite change, chills, diaphoresis, fatigue and fever.  Gastrointestinal:  Negative for abdominal pain and nausea.  Genitourinary:  Positive for dysuria and hematuria. Negative for flank pain, pelvic pain, urgency and vaginal discharge.     Physical Exam Triage Vital Signs ED Triage Vitals  Enc Vitals Group     BP 11/11/21 1105 123/85     Pulse Rate 11/11/21 1105 73     Resp 11/11/21 1105 16     Temp 11/11/21 1105 98.3 F (36.8 C)     Temp Source 11/11/21 1105 Oral     SpO2 11/11/21 1105 98 %     Weight --      Height --      Head Circumference --      Peak Flow --      Pain Score 11/11/21 1103 0     Pain Loc --      Pain Edu? --      Excl. in [place]? --    No data found.  Updated Vital Signs BP 123/85 (BP Location: Right Arm)   Pulse 73   Temp 98.3 F (36.8 C) (Oral)   Resp 16   SpO2 98%   Visual Acuity Right Eye Distance:   Left Eye Distance:   Bilateral Distance:    Right Eye Near:   Left Eye Near:    Bilateral Near:     Physical Exam Nursing notes and Vital Signs reviewed. Appearance:  Patient appears stated age, and in no acute distress.    Eyes:  Pupils are equal, round, and reactive to light and accomodation.  Extraocular movement is intact.  Conjunctivae are not inflamed   Pharynx:  Normal; moist mucous membranes  Neck:  Supple.  No adenopathy Lungs:  Clear to auscultation.  Breath sounds are equal.  Moving air well. Heart:  Regular rate and rhythm without murmurs, rubs, or gallops.  Abdomen:  Nontender without masses or hepatosplenomegaly.  Bowel sounds are present.  No CVA or flank tenderness.  Extremities:  No edema.  Skin:  No rash present.     UC Treatments / Results  Labs (all labs ordered are listed, but only abnormal results are displayed) Labs Reviewed  POCT URINALYSIS DIP (MANUAL ENTRY) - Abnormal; Notable for the following components:      Result Value    Clarity, UA cloudy (*)    Spec Grav, UA >=1.030 (*)    Blood, UA moderate (*)    Protein Ur, POC =100 (*)    Nitrite, UA Positive (*)    Leukocytes, UA Moderate (2+) (*)    All other components within normal limits  URINE CULTURE    EKG   Radiology No results found.  Procedures Procedures (including critical care time)  Medications Ordered in UC Medications - No data to display  Initial Impression / Assessment and Plan / UC Course  I have reviewed  the triage vital signs and the nursing notes.  Pertinent labs & imaging results that were available during my care of the patient were reviewed by me and considered in my medical decision making (see chart for details).    Urine culture pending.  Begin Keflex. Followup with Family Doctor if not improved in 5 days.  Final Clinical Impressions(s) / UC Diagnoses   Final diagnoses:  Dysuria  Cystitis     Discharge Instructions      Increase fluid intake. If symptoms become significantly worse during the night or over the weekend, proceed to the local emergency room.  Recommend evaluation by urologist if symptoms continue to recur.    ED Prescriptions     Medication Sig Dispense Auth. Provider   cephALEXin (KEFLEX) 500 MG capsule Take 1 capsule (500 mg total) by mouth 2 (two) times daily. 10 capsule Lattie Haw, MD         Lattie Haw, MD 11/14/21 [id number]

## 2021-11-11 NOTE — Discharge Instructions (Signed)
Increase fluid intake. If symptoms become significantly worse during the night or over the weekend, proceed to the local emergency room.  Recommend evaluation by urologist if symptoms continue to recur.

## 2021-11-13 LAB — URINE CULTURE: Culture: >=100000 — AB

## 2021-11-26 DIAGNOSIS — J4520 Mild intermittent asthma, uncomplicated: Secondary | ICD-10-CM | POA: Diagnosis not present

## 2021-11-26 DIAGNOSIS — G4733 Obstructive sleep apnea (adult) (pediatric): Secondary | ICD-10-CM | POA: Insufficient documentation

## 2021-11-26 DIAGNOSIS — I10 Essential (primary) hypertension: Secondary | ICD-10-CM | POA: Diagnosis not present

## 2021-11-26 DIAGNOSIS — Z23 Encounter for immunization: Secondary | ICD-10-CM | POA: Diagnosis not present

## 2021-12-02 DIAGNOSIS — F411 Generalized anxiety disorder: Secondary | ICD-10-CM | POA: Diagnosis not present

## 2021-12-02 IMAGING — DX DG FOOT COMPLETE 3+V*L*
3 series · 3 of 3 positions shown · non-contrast
Comparison: None.

CLINICAL DATA: Left foot pain.  Heard a pop while stretching

EXAM:
LEFT FOOT - COMPLETE 3+ VIEW

[foot ap]
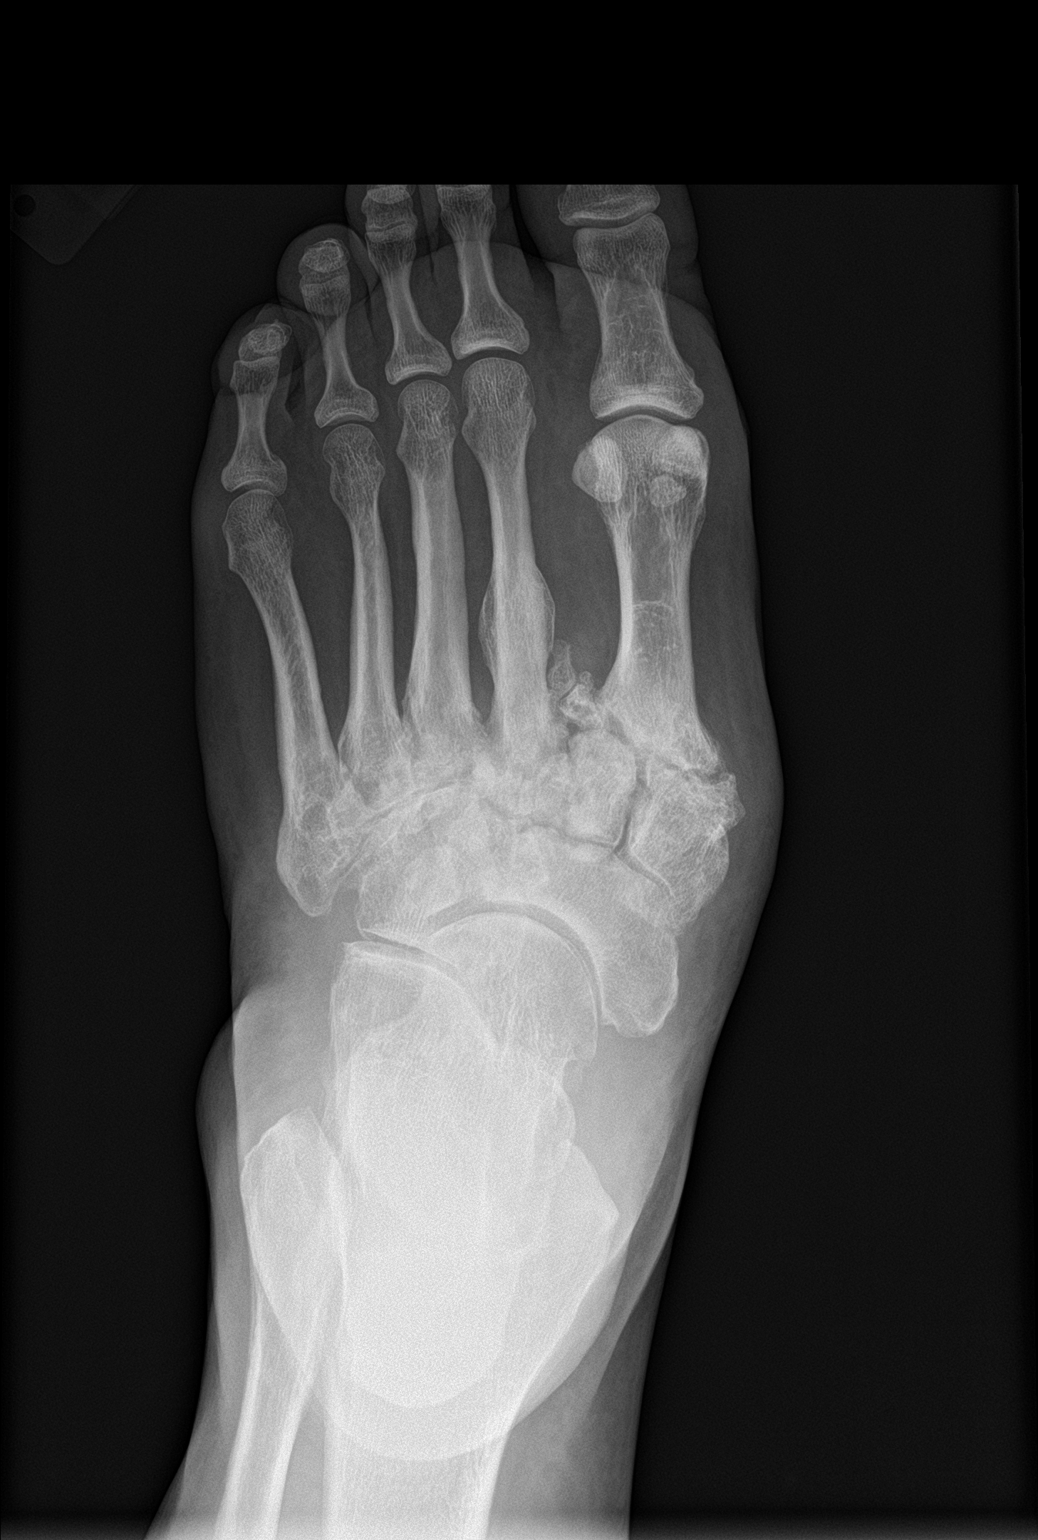

[foot obl]
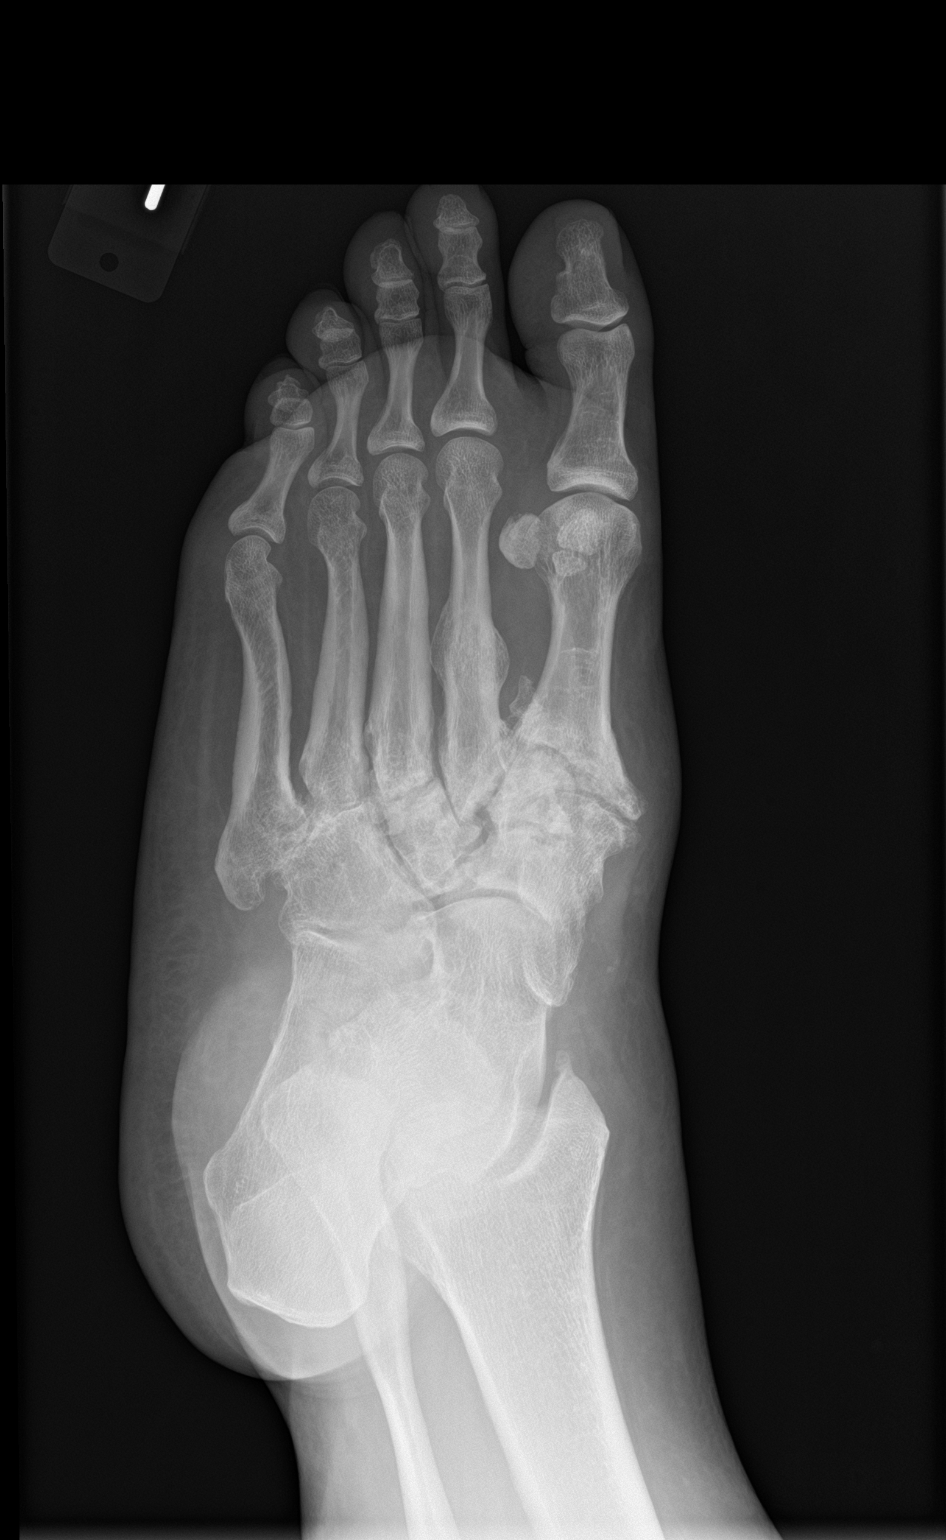

[foot lat]
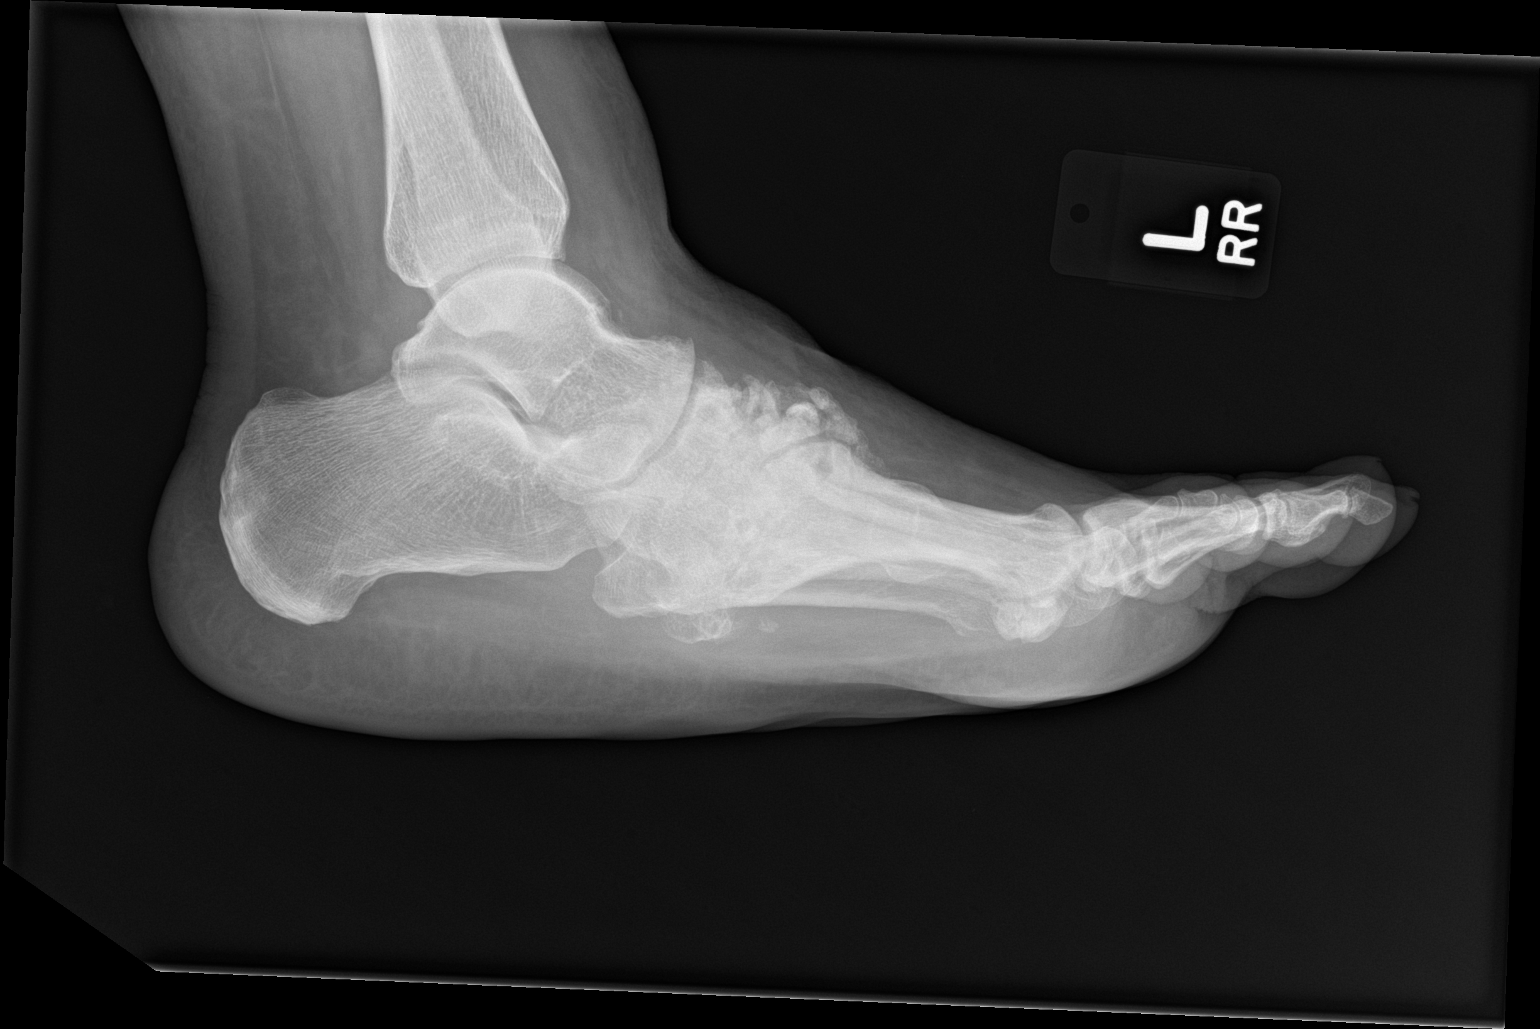

[3 of 3 positions shown; findings below may reference images not displayed]

FINDINGS: No acute fracture or dislocation. Chronic divergent Lisfranc
fracture subluxation. Severe advanced arthropathy of the
tarsometatarsal joints most severe at the first and second TMT
joints. Healed second metatarsal shaft fracture. Mild osteoarthritis
of the talonavicular joint. Mild osteoarthritis of the
calcaneocuboid joint. Soft tissues are unremarkable.
IMPRESSION: 1. No acute osseous injury of the left foot.
2. Chronic divergent Lisfranc fracture subluxation. Severe advanced
arthropathy of the tarsometatarsal joints most severe at the first
and second TMT joints.

## 2021-12-10 DIAGNOSIS — Z6841 Body Mass Index (BMI) 40.0 and over, adult: Secondary | ICD-10-CM | POA: Diagnosis not present

## 2021-12-10 DIAGNOSIS — I10 Essential (primary) hypertension: Secondary | ICD-10-CM | POA: Diagnosis not present

## 2021-12-10 DIAGNOSIS — Z713 Dietary counseling and surveillance: Secondary | ICD-10-CM | POA: Diagnosis not present

## 2021-12-15 DIAGNOSIS — M25552 Pain in left hip: Secondary | ICD-10-CM | POA: Diagnosis not present

## 2021-12-15 DIAGNOSIS — M25562 Pain in left knee: Secondary | ICD-10-CM | POA: Diagnosis not present

## 2021-12-15 DIAGNOSIS — Z96652 Presence of left artificial knee joint: Secondary | ICD-10-CM | POA: Diagnosis not present

## 2021-12-29 DIAGNOSIS — M25562 Pain in left knee: Secondary | ICD-10-CM | POA: Diagnosis not present

## 2021-12-29 DIAGNOSIS — G8929 Other chronic pain: Secondary | ICD-10-CM | POA: Insufficient documentation

## 2021-12-29 DIAGNOSIS — M25552 Pain in left hip: Secondary | ICD-10-CM | POA: Diagnosis not present

## 2021-12-29 DIAGNOSIS — Z96652 Presence of left artificial knee joint: Secondary | ICD-10-CM | POA: Diagnosis not present

## 2022-01-08 DIAGNOSIS — Z96652 Presence of left artificial knee joint: Secondary | ICD-10-CM | POA: Diagnosis not present

## 2022-01-08 DIAGNOSIS — M25552 Pain in left hip: Secondary | ICD-10-CM | POA: Diagnosis not present

## 2022-01-08 DIAGNOSIS — M25562 Pain in left knee: Secondary | ICD-10-CM | POA: Diagnosis not present

## 2022-01-14 DIAGNOSIS — Z6841 Body Mass Index (BMI) 40.0 and over, adult: Secondary | ICD-10-CM | POA: Diagnosis not present

## 2022-01-14 DIAGNOSIS — I10 Essential (primary) hypertension: Secondary | ICD-10-CM | POA: Diagnosis not present

## 2022-01-14 DIAGNOSIS — Z713 Dietary counseling and surveillance: Secondary | ICD-10-CM | POA: Diagnosis not present

## 2022-01-14 DIAGNOSIS — F411 Generalized anxiety disorder: Secondary | ICD-10-CM | POA: Diagnosis not present

## 2022-01-24 DIAGNOSIS — R937 Abnormal findings on diagnostic imaging of other parts of musculoskeletal system: Secondary | ICD-10-CM | POA: Diagnosis not present

## 2022-01-24 DIAGNOSIS — S91301A Unspecified open wound, right foot, initial encounter: Secondary | ICD-10-CM | POA: Diagnosis not present

## 2022-01-24 DIAGNOSIS — S9031XA Contusion of right foot, initial encounter: Secondary | ICD-10-CM | POA: Diagnosis not present

## 2022-01-24 DIAGNOSIS — W208XXA Other cause of strike by thrown, projected or falling object, initial encounter: Secondary | ICD-10-CM | POA: Diagnosis not present

## 2022-01-24 DIAGNOSIS — Z23 Encounter for immunization: Secondary | ICD-10-CM | POA: Diagnosis not present

## 2022-02-08 DIAGNOSIS — Z6841 Body Mass Index (BMI) 40.0 and over, adult: Secondary | ICD-10-CM | POA: Diagnosis not present

## 2022-02-08 DIAGNOSIS — E6609 Other obesity due to excess calories: Secondary | ICD-10-CM | POA: Diagnosis not present

## 2022-02-08 DIAGNOSIS — I10 Essential (primary) hypertension: Secondary | ICD-10-CM | POA: Diagnosis not present

## 2022-02-08 DIAGNOSIS — Z713 Dietary counseling and surveillance: Secondary | ICD-10-CM | POA: Diagnosis not present

## 2022-03-05 DIAGNOSIS — R42 Dizziness and giddiness: Secondary | ICD-10-CM | POA: Diagnosis not present

## 2022-03-05 DIAGNOSIS — I10 Essential (primary) hypertension: Secondary | ICD-10-CM | POA: Diagnosis not present

## 2022-03-18 DIAGNOSIS — Z6841 Body Mass Index (BMI) 40.0 and over, adult: Secondary | ICD-10-CM | POA: Diagnosis not present

## 2022-03-18 DIAGNOSIS — I10 Essential (primary) hypertension: Secondary | ICD-10-CM | POA: Diagnosis not present

## 2022-03-18 DIAGNOSIS — E6609 Other obesity due to excess calories: Secondary | ICD-10-CM | POA: Diagnosis not present

## 2022-03-31 DIAGNOSIS — F411 Generalized anxiety disorder: Secondary | ICD-10-CM | POA: Diagnosis not present

## 2022-04-15 DIAGNOSIS — E6609 Other obesity due to excess calories: Secondary | ICD-10-CM | POA: Diagnosis not present

## 2022-04-15 DIAGNOSIS — I10 Essential (primary) hypertension: Secondary | ICD-10-CM | POA: Diagnosis not present

## 2022-04-15 DIAGNOSIS — Z6841 Body Mass Index (BMI) 40.0 and over, adult: Secondary | ICD-10-CM | POA: Diagnosis not present

## 2022-04-22 DIAGNOSIS — F411 Generalized anxiety disorder: Secondary | ICD-10-CM | POA: Diagnosis not present

## 2022-04-22 DIAGNOSIS — I10 Essential (primary) hypertension: Secondary | ICD-10-CM | POA: Diagnosis not present

## 2022-04-22 DIAGNOSIS — Z13220 Encounter for screening for lipoid disorders: Secondary | ICD-10-CM | POA: Diagnosis not present

## 2022-04-22 DIAGNOSIS — Z79899 Other long term (current) drug therapy: Secondary | ICD-10-CM | POA: Diagnosis not present

## 2022-04-29 DIAGNOSIS — Z1231 Encounter for screening mammogram for malignant neoplasm of breast: Secondary | ICD-10-CM | POA: Diagnosis not present

## 2022-04-29 DIAGNOSIS — Z124 Encounter for screening for malignant neoplasm of cervix: Secondary | ICD-10-CM | POA: Diagnosis not present

## 2022-04-29 DIAGNOSIS — Z1330 Encounter for screening examination for mental health and behavioral disorders, unspecified: Secondary | ICD-10-CM | POA: Diagnosis not present

## 2022-04-29 DIAGNOSIS — Z23 Encounter for immunization: Secondary | ICD-10-CM | POA: Diagnosis not present

## 2022-04-29 DIAGNOSIS — I10 Essential (primary) hypertension: Secondary | ICD-10-CM | POA: Diagnosis not present

## 2022-04-29 DIAGNOSIS — J4520 Mild intermittent asthma, uncomplicated: Secondary | ICD-10-CM | POA: Diagnosis not present

## 2022-04-29 DIAGNOSIS — Z0000 Encounter for general adult medical examination without abnormal findings: Secondary | ICD-10-CM | POA: Diagnosis not present

## 2022-04-29 LAB — RESULTS CONSOLE HPV: CHL HPV: NEGATIVE

## 2022-04-29 LAB — HM PAP SMEAR: HM Pap smear: NORMAL

## 2022-05-20 DIAGNOSIS — F439 Reaction to severe stress, unspecified: Secondary | ICD-10-CM | POA: Diagnosis not present

## 2022-05-20 DIAGNOSIS — Z6841 Body Mass Index (BMI) 40.0 and over, adult: Secondary | ICD-10-CM | POA: Diagnosis not present

## 2022-05-20 DIAGNOSIS — E6609 Other obesity due to excess calories: Secondary | ICD-10-CM | POA: Diagnosis not present

## 2022-05-20 DIAGNOSIS — I10 Essential (primary) hypertension: Secondary | ICD-10-CM | POA: Diagnosis not present

## 2022-06-21 DIAGNOSIS — F411 Generalized anxiety disorder: Secondary | ICD-10-CM | POA: Diagnosis not present

## 2022-06-24 DIAGNOSIS — R635 Abnormal weight gain: Secondary | ICD-10-CM | POA: Diagnosis not present

## 2022-06-24 DIAGNOSIS — I10 Essential (primary) hypertension: Secondary | ICD-10-CM | POA: Diagnosis not present

## 2022-06-24 DIAGNOSIS — Z6841 Body Mass Index (BMI) 40.0 and over, adult: Secondary | ICD-10-CM | POA: Diagnosis not present

## 2022-07-28 DIAGNOSIS — Z6841 Body Mass Index (BMI) 40.0 and over, adult: Secondary | ICD-10-CM | POA: Diagnosis not present

## 2022-07-28 DIAGNOSIS — Z713 Dietary counseling and surveillance: Secondary | ICD-10-CM | POA: Diagnosis not present

## 2022-09-01 DIAGNOSIS — Z713 Dietary counseling and surveillance: Secondary | ICD-10-CM | POA: Diagnosis not present

## 2022-09-01 DIAGNOSIS — E6609 Other obesity due to excess calories: Secondary | ICD-10-CM | POA: Diagnosis not present

## 2022-09-01 DIAGNOSIS — Z6841 Body Mass Index (BMI) 40.0 and over, adult: Secondary | ICD-10-CM | POA: Diagnosis not present

## 2022-09-01 DIAGNOSIS — I10 Essential (primary) hypertension: Secondary | ICD-10-CM | POA: Diagnosis not present

## 2022-09-17 DIAGNOSIS — S8992XA Unspecified injury of left lower leg, initial encounter: Secondary | ICD-10-CM | POA: Diagnosis not present

## 2022-09-17 DIAGNOSIS — X58XXXA Exposure to other specified factors, initial encounter: Secondary | ICD-10-CM | POA: Diagnosis not present

## 2022-09-17 DIAGNOSIS — M25462 Effusion, left knee: Secondary | ICD-10-CM | POA: Diagnosis not present

## 2022-09-21 DIAGNOSIS — M25562 Pain in left knee: Secondary | ICD-10-CM | POA: Diagnosis not present

## 2022-10-11 DIAGNOSIS — F411 Generalized anxiety disorder: Secondary | ICD-10-CM | POA: Diagnosis not present

## 2022-10-28 DIAGNOSIS — E559 Vitamin D deficiency, unspecified: Secondary | ICD-10-CM | POA: Diagnosis not present

## 2022-10-28 DIAGNOSIS — F411 Generalized anxiety disorder: Secondary | ICD-10-CM | POA: Diagnosis not present

## 2022-10-28 DIAGNOSIS — I10 Essential (primary) hypertension: Secondary | ICD-10-CM | POA: Diagnosis not present

## 2022-10-28 DIAGNOSIS — B372 Candidiasis of skin and nail: Secondary | ICD-10-CM | POA: Diagnosis not present

## 2022-10-28 DIAGNOSIS — Z1331 Encounter for screening for depression: Secondary | ICD-10-CM | POA: Diagnosis not present

## 2022-11-04 ENCOUNTER — Ambulatory Visit
Admission: EM | Admit: 2022-11-04 | Discharge: 2022-11-04 | Disposition: A | Discharge status: Home / Self Care | Payer: BC Managed Care – PPO

## 2022-11-04 DIAGNOSIS — S61215A Laceration without foreign body of left ring finger without damage to nail, initial encounter: Secondary | ICD-10-CM

## 2022-11-04 DIAGNOSIS — Z6841 Body Mass Index (BMI) 40.0 and over, adult: Secondary | ICD-10-CM | POA: Diagnosis not present

## 2022-11-04 DIAGNOSIS — I10 Essential (primary) hypertension: Secondary | ICD-10-CM | POA: Diagnosis not present

## 2022-11-04 DIAGNOSIS — E6609 Other obesity due to excess calories: Secondary | ICD-10-CM | POA: Diagnosis not present

## 2022-11-04 DIAGNOSIS — S6992XA Unspecified injury of left wrist, hand and finger(s), initial encounter: Secondary | ICD-10-CM

## 2022-11-04 HISTORY — DX: Anxiety disorder, unspecified: F41.9

## 2022-11-04 MED ORDER — CEPHALEXIN 500 MG PO CAPS: 500.0000 mg | ORAL_CAPSULE | Freq: Two times a day (BID) | ORAL | 0 refills | Status: AC

## 2022-11-04 NOTE — ED Provider Notes (Signed)
[facility]    CSN: [id number] Arrival date & time: 11/04/22  1530      History   Chief Complaint Chief Complaint  Patient presents with   Finger Injury    L ring finger     HPI Debra Nolan is a 52 y.o. female.   HPI Pleasant 52 year old female presents with left ring finger injury.  Patient reports cutting her left ring finger accidentally on metal book shells 1.5 hours ago.  PMH significant for morbid obesity and sciatica.  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      Home Medications    Prior to Admission medications   Medication Sig Start Date End Date Taking? Authorizing Provider  albuterol (VENTOLIN HFA) 108 (90 Base) MCG/ACT inhaler Inhale into the lungs. 01/12/18  Yes [provider]  beclomethasone (QVAR REDIHALER) 80 MCG/ACT inhaler Inhale into the lungs. 01/13/18  Yes [provider]  cephALEXin (KEFLEX) 500 MG capsule Take 1 capsule (500 mg total) by mouth 2 (two) times daily for 7 days. 11/04/22 11/11/22 Yes Trevor Iha, FNP  cholecalciferol (VITAMIN D3) 25 MCG (1000 UNIT) tablet Take 2,000 Units by mouth daily.   Yes [provider]  citalopram (CELEXA) 20 MG tablet Take 20 mg by mouth daily. 11/02/19  Yes [provider]  fluticasone (FLONASE) 50 MCG/ACT nasal spray Flonase Allergy Relief 50 mcg/actuation nasal spray,suspension  Inhale 2 sprays every day by intranasal route.   Yes [provider]  gabapentin (NEURONTIN) 600 MG tablet Take 600 mg by mouth daily. 06/06/19  Yes [provider]  lisinopril-hydrochlorothiazide  (ZESTORETIC) 20-12.5 MG tablet Take 0.5 tablets by mouth daily.   Yes [provider]  Multiple Vitamin (THERA) TABS Take 1 tablet by mouth every morning.   Yes [provider]  azelastine (ASTELIN) 0.1 % nasal spray Place into the nose. Patient not taking: Reported on 10/19/2021 09/23/15   [provider]  celecoxib (CELEBREX) 200 MG capsule TAKE 1 CAPSULE(200 MG) BY MOUTH DAILY Patient not taking: Reported on 10/19/2021 01/22/20   Vivi Barrack, DPM  escitalopram (LEXAPRO) 10 MG tablet Take 10 mg by mouth daily. Patient not taking: Reported on 10/19/2021    [provider]  hydrochlorothiazide (HYDRODIURIL) 12.5 MG tablet Take 12.5 mg by mouth daily. Patient not taking: Reported on 10/19/2021 06/14/19   [provider]  lisinopril (ZESTRIL) 10 MG tablet Take 10 mg by mouth daily. Patient not taking: Reported on 10/19/2021 06/14/19   [provider]  phentermine (ADIPEX-P) 37.5 MG tablet Take by mouth. 04/19/21   [provider]  predniSONE (DELTASONE) 20 MG tablet Take 3 tabs PO daily x 5 days. Patient not taking: Reported on 10/19/2021 04/23/21   Trevor Iha, FNP    Family History Family History  Problem Relation Age of Onset   Healthy Mother    Healthy Father    Healthy Sister     Social History Social History   Tobacco Use   Smoking status: Never  Smokeless tobacco: Never  Vaping Use   Vaping status: Never Used  Substance Use Topics   Alcohol use: Yes    Comment: occ   Drug use: Never     Allergies   Patient has no known allergies.   Review of Systems Review of Systems  Skin:  Positive for wound.  All other systems reviewed and are negative.    Physical Exam Triage Vital Signs ED Triage Vitals  Encounter Vitals Group     BP 11/04/22 1541 139/89     Systolic BP Percentile --      Diastolic BP Percentile --      Pulse Rate 11/04/22 1541 82     Resp 11/04/22 1541 18     Temp 11/04/22 1541 98.3 F  (36.8 C)     Temp Source 11/04/22 1541 Oral     SpO2 11/04/22 1541 97 %     Weight --      Height --      Head Circumference --      Peak Flow --      Pain Score 11/04/22 1542 4     Pain Loc --      Pain Education --      Exclude from Growth Chart --    No data found.  Updated Vital Signs BP 139/89 (BP Location: Right Arm)   Pulse 82   Temp 98.3 F (36.8 C) (Oral)   Resp 18   LMP 08/20/2022   SpO2 97%    Physical Exam Vitals and nursing note reviewed.  Constitutional:      Appearance: Normal appearance. She is obese.  HENT:     Head: Normocephalic and atraumatic.     Mouth/Throat:     Mouth: Mucous membranes are moist.     Pharynx: Oropharynx is clear.  Eyes:     Extraocular Movements: Extraocular movements intact.     Conjunctiva/sclera: Conjunctivae normal.     Pupils: Pupils are equal, round, and reactive to light.  Cardiovascular:     Rate and Rhythm: Normal rate and regular rhythm.     Pulses: Normal pulses.     Heart sounds: Normal heart sounds.  Pulmonary:     Effort: Pulmonary effort is normal.     Breath sounds: Normal breath sounds. No wheezing, rhonchi or rales.  Musculoskeletal:        General: Normal range of motion.     Cervical back: Normal range of motion and neck supple.  Skin:    General: Skin is warm and dry.     Comments: Left ring finger (volar aspect of DIP): ~ 8 mm x 0.5 mm laceration noted, bleeding achieved by homeostasis.  Wound wound area inspected with loops no foreign body noted; nonadherent Telfa with Coban applied to wound area prior to discharge  Neurological:     General: No focal deficit present.     Mental Status: She is alert and oriented to person, place, and time.  Psychiatric:        Mood and Affect: Mood normal.        Behavior: Behavior normal.        Thought Content: Thought content normal.      UC Treatments / Results  Labs (all labs ordered are listed, but only abnormal results are displayed) Labs Reviewed -  No data to display  EKG   Radiology No results found.  Procedures Procedures (including critical care time)  Medications Ordered in UC Medications - No data to  display  Initial Impression / Assessment and Plan / UC Course  I have reviewed the triage vital signs and the nursing notes.  Pertinent labs & imaging results that were available during my care of the patient were reviewed by me and considered in my medical decision making (see chart for details).     MDM: 1.  Left ring finger injury, initial encounter-Rx'd Keflex, wound bandage placed on affected area prior to discharge. Advised patient to remove pressure bandage after 24 hours.  Advised allow healing by secondary intention/scab formation.  2.  Injury of left ring finger, initial encounter-Rx'd Keflex, for infection prophylaxis of minor laceration of left ring finger.  Advised patient to take medication as directed with food to completion.  Encouraged increase daily water intake to 64 ounces per day while taking this medication.  Advised if symptoms worsen and/or unresolved please follow-up PCP or here for further evaluation.  Final Clinical Impressions(s) / UC Diagnoses   Final diagnoses:  Laceration of left ring finger without foreign body without damage to nail, initial encounter  Injury of left ring finger, initial encounter     Discharge Instructions      Advised patient to remove pressure bandage after 24 hours.  Advised allow healing by secondary intention/scab formation.  Advised patient to take medication as directed with food to completion.  Encouraged increase daily water intake to 64 ounces per day while taking this medication.  Advised if symptoms worsen and/or unresolved please follow-up PCP or here for further evaluation.     ED Prescriptions     Medication Sig Dispense Auth. Provider   cephALEXin (KEFLEX) 500 MG capsule Take 1 capsule (500 mg total) by mouth 2 (two) times daily for 7 days. 14 capsule  Trevor Iha, FNP      PDMP not reviewed this encounter.   Trevor Iha, FNP 11/04/22 [phone]

## 2022-11-04 NOTE — ED Triage Notes (Signed)
Wound cleaned and dressing applied.  

## 2022-11-04 NOTE — Discharge Instructions (Addendum)
Advised patient to remove pressure bandage after 24 hours.  Advised allow healing by secondary intention/scab formation.  Advised patient to take medication as directed with food to completion.  Encouraged increase daily water intake to 64 ounces per day while taking this medication.  Advised if symptoms worsen and/or unresolved please follow-up PCP or here for further evaluation.

## 2022-11-24 ENCOUNTER — Encounter: Payer: Self-pay | Admitting: Family Medicine

## 2022-11-24 ENCOUNTER — Ambulatory Visit (INDEPENDENT_AMBULATORY_CARE_PROVIDER_SITE_OTHER): Payer: BC Managed Care – PPO | Admitting: Family Medicine

## 2022-11-24 VITALS — BP 120/76 | HR 74 | Temp 98.1°F | Resp 18 | Ht 70.0 in | Wt 305.0 lb

## 2022-11-24 DIAGNOSIS — Z23 Encounter for immunization: Secondary | ICD-10-CM | POA: Diagnosis not present

## 2022-11-24 DIAGNOSIS — Z7689 Persons encountering health services in other specified circumstances: Secondary | ICD-10-CM | POA: Diagnosis not present

## 2022-11-24 DIAGNOSIS — L304 Erythema intertrigo: Secondary | ICD-10-CM | POA: Diagnosis not present

## 2022-11-24 DIAGNOSIS — M5136 Other intervertebral disc degeneration, lumbar region: Secondary | ICD-10-CM | POA: Insufficient documentation

## 2022-11-24 DIAGNOSIS — I10 Essential (primary) hypertension: Secondary | ICD-10-CM | POA: Insufficient documentation

## 2022-11-24 DIAGNOSIS — F419 Anxiety disorder, unspecified: Secondary | ICD-10-CM | POA: Insufficient documentation

## 2022-11-24 DIAGNOSIS — J302 Other seasonal allergic rhinitis: Secondary | ICD-10-CM | POA: Insufficient documentation

## 2022-11-24 MED ORDER — NYSTATIN-TRIAMCINOLONE 100000-0.1 UNIT/GM-% EX OINT: 1.0000 | TOPICAL_OINTMENT | Freq: Two times a day (BID) | CUTANEOUS | 1 refills | Status: AC | PRN

## 2022-11-24 NOTE — Progress Notes (Signed)
New Patient Office Visit  Subjective    Patient ID: Debra Nolan, female    DOB: Jul 26, 1970  Age: 52 y.o. MRN: [id number]  CC:  Chief Complaint  Patient presents with   Establish Care    Patient is here to establish with new PCP, Patient would like to discuss a rash she has been dealing with since 10/08/22, she states the rash is under her breast and around groin area    HPI Debra Nolan presents to establish care. Pt is new to me.   Rash Present underneath her breast and in groin area. It has been present for the last month. It's itchy. She has seen her PCP for this and tried a steroid cream, which didn't help.  Pt reports she is seeing a dietitian through [facility] [place].  All chronic conditions are stable and chronic. Recently had CPE with prior PCP. She has Celebrex 100mg  daily prn for arthritis pain.  She has Celexa 20mg  daily for anxiety. She has seasonal allergies and uses otc flonase and Claritin daily prn. She uses Albuterol prn for illness during allergy season. 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 She has hx of HTN and using Zestoretic 20/12.5mg  1/2 tab po daily. Pt would like flu vaccine.  Outpatient Encounter Medications as of 11/24/2022  Medication Sig   celecoxib (CELEBREX) 100 MG capsule Take 100 mg by mouth daily.   cholecalciferol (VITAMIN D3) 25 MCG (1000 UNIT) tablet Take 2,000 Units by mouth daily.   citalopram (CELEXA) 20 MG tablet Take 20 mg by mouth daily.   fluticasone (FLONASE) 50 MCG/ACT nasal spray Flonase Allergy Relief 50 mcg/actuation nasal spray,suspension  Inhale 2 sprays every day by intranasal route.   gabapentin (NEURONTIN) 600 MG tablet Take 600 mg by mouth daily.   lisinopril-hydrochlorothiazide (ZESTORETIC) 20-12.5 MG tablet Take 0.5 tablets by mouth daily.   Multiple Vitamin (THERA) TABS Take 1 tablet by mouth every morning.    nystatin-triamcinolone ointment (MYCOLOG) Apply 1 Application topically 2 (two) times daily as needed.   albuterol (VENTOLIN HFA) 108 (90 Base) MCG/ACT inhaler Inhale into the lungs. (Patient not taking: Reported on 11/24/2022)   beclomethasone (QVAR REDIHALER) 80 MCG/ACT inhaler Inhale into the lungs. (Patient not taking: Reported on 11/24/2022)   [DISCONTINUED] azelastine (ASTELIN) 0.1 % nasal spray Place into the nose. (Patient not taking: Reported on 10/19/2021)   [DISCONTINUED] celecoxib (CELEBREX) 200 MG capsule TAKE 1 CAPSULE(200 MG) BY MOUTH DAILY (Patient not taking: Reported on 10/19/2021)   [DISCONTINUED] escitalopram (LEXAPRO) 10 MG tablet Take 10 mg by mouth daily. (Patient not taking: Reported on 10/19/2021)   [DISCONTINUED] hydrochlorothiazide (HYDRODIURIL) 12.5 MG tablet Take 12.5 mg by mouth daily. (Patient not taking: Reported on 10/19/2021)   [DISCONTINUED] lisinopril (ZESTRIL) 10 MG tablet Take 10 mg by mouth daily. (Patient not taking: Reported on 10/19/2021)   [DISCONTINUED] phentermine (ADIPEX-P) 37.5 MG tablet Take by mouth.   [DISCONTINUED] predniSONE (DELTASONE) 20 MG tablet Take 3 tabs PO daily x 5 days. (Patient not taking: Reported on 10/19/2021)   No facility-administered encounter medications on file as of 11/24/2022.    Past Medical History:  Diagnosis Date   Anxiety    DDD (degenerative disc disease), lumbar    Hypertension    Seasonal allergies     Past Surgical History:  Procedure Laterality Date   right foot surgery     right knee surgery  Family History  Problem Relation Age of Onset   Osteoarthritis Mother    Healthy Mother    Healthy Father    Healthy Sister     Social History   Socioeconomic History   Marital status: Divorced    Spouse name: Not on file   Number of children: Not on file   Years of education: Not on file   Highest education level: Not on file  Occupational History   Not on file  Tobacco Use   Smoking status: Never     Passive exposure: Never   Smokeless tobacco: Never  Vaping Use   Vaping status: Never Used  Substance and Sexual Activity   Alcohol use: Yes    Comment: occ   Drug use: Never   Sexual activity: Not on file  Other Topics Concern   Not on file  Social History Narrative   Not on file   Social Determinants of Health   Financial Resource Strain: Low Risk  (04/29/2022)   Received from [facility]   Overall Financial Resource Strain (CARDIA)    Difficulty of Paying Living Expenses: Not hard at all  Food Insecurity: No Food Insecurity (04/29/2022)   Received from [facility]   Hunger Vital Sign    Worried About Running Out of Food in the Last Year: Never true    Ran Out of Food in the Last Year: Never true  Transportation Needs: No Transportation Needs (04/29/2022)   Received from [facility] - Transportation    Lack of Transportation (Medical): No    Lack of Transportation (Non-Medical): No  Physical Activity: Insufficiently Active (03/05/2022)   Received from [facility]   Exercise Vital Sign    Days of Exercise per Week: 2 days    Minutes of Exercise per Session: 30 min  Stress: Stress Concern Present (03/05/2022)   Received from [facility] - Occupational Stress Questionnaire    Feeling of Stress : To some extent  Social Connections: Socially Isolated (03/05/2022)   Received from [facility]   Social Network    How would you rate your social network (family, work, friends)?: Little participation, lonely and socially isolated  Intimate Partner Violence: Not At Risk (03/05/2022)   Received from [facility]   HITS    Over the last 12 months how often did your partner physically hurt you?: 1    Over the last 12 months how often did your partner insult you or talk down to you?: 1    Over the last 12 months how often did your partner threaten you with physical harm?: 1    Over the last 12 months how often did your  partner scream or curse at you?: 1    Review of Systems  Skin:  Positive for itching and rash.  All other systems reviewed and are negative.       Objective    BP 120/76   Pulse 74   Temp 98.1 F (36.7 C) (Oral)   Resp 18   Ht 5\' 10"  (1.778 m)   Wt (!) 305 lb (138.3 kg)   LMP 08/20/2022   SpO2 96%   BMI 43.76 kg/m   Physical Exam Constitutional:      Appearance: Normal appearance. She is obese.  HENT:     Head: Normocephalic and atraumatic.     Right Ear: External ear normal.     Left Ear: External ear normal.  Nose: Nose normal.     Mouth/Throat:     Mouth: Mucous membranes are moist.  Eyes:     Extraocular Movements: Extraocular movements intact.  Cardiovascular:     Rate and Rhythm: Normal rate and regular rhythm.  Pulmonary:     Effort: Pulmonary effort is normal.     Breath sounds: Normal breath sounds.  Skin:    Findings: Erythema and rash present.     Comments: Erythematous Rash underneath bilateral breasts and groin  Neurological:     General: No focal deficit present.     Mental Status: She is alert and oriented to person, place, and time. Mental status is at baseline.  Psychiatric:        Mood and Affect: Mood normal.        Behavior: Behavior normal.        Thought Content: Thought content normal.        Judgment: Judgment normal.        Assessment & Plan:   Problem List Items Addressed This Visit   None Visit Diagnoses     Encounter to establish care with new doctor    -  Primary   Intertrigo       Relevant Medications   nystatin-triamcinolone ointment (MYCOLOG)   Need for influenza vaccination       Relevant Orders   Flu vaccine trivalent PF, 6mos and older(Flulaval,Afluria,Fluarix,Fluzone)      Encounter to establish care with new doctor  Intertrigo -     Nystatin-Triamcinolone; Apply 1 Application topically 2 (two) times daily as needed.  Dispense: 60 g; Refill: 1  Need for influenza vaccination -     Flu vaccine  trivalent PF, 6mos and older(Flulaval,Afluria,Fluarix,Fluzone)  Rash likely intertrigo. Will treat with Nystatin/Triamcinolone ointment BID to affected areas Flu vaccine today Return in about 5 months (around 05/02/2023) for Annual Physical.   Suzan Slick, MD

## 2022-12-08 DIAGNOSIS — Z713 Dietary counseling and surveillance: Secondary | ICD-10-CM | POA: Diagnosis not present

## 2022-12-08 DIAGNOSIS — E66813 Obesity, class 3: Secondary | ICD-10-CM | POA: Diagnosis not present

## 2022-12-08 DIAGNOSIS — F439 Reaction to severe stress, unspecified: Secondary | ICD-10-CM | POA: Diagnosis not present

## 2022-12-08 DIAGNOSIS — Z6841 Body Mass Index (BMI) 40.0 and over, adult: Secondary | ICD-10-CM | POA: Diagnosis not present

## 2022-12-08 DIAGNOSIS — I10 Essential (primary) hypertension: Secondary | ICD-10-CM | POA: Diagnosis not present

## 2022-12-08 DIAGNOSIS — Z723 Lack of physical exercise: Secondary | ICD-10-CM | POA: Diagnosis not present

## 2023-01-18 DIAGNOSIS — F411 Generalized anxiety disorder: Secondary | ICD-10-CM | POA: Diagnosis not present

## 2023-01-20 DIAGNOSIS — Z713 Dietary counseling and surveillance: Secondary | ICD-10-CM | POA: Diagnosis not present

## 2023-01-20 DIAGNOSIS — E66813 Obesity, class 3: Secondary | ICD-10-CM | POA: Diagnosis not present

## 2023-01-20 DIAGNOSIS — I10 Essential (primary) hypertension: Secondary | ICD-10-CM | POA: Diagnosis not present

## 2023-01-20 DIAGNOSIS — Z6841 Body Mass Index (BMI) 40.0 and over, adult: Secondary | ICD-10-CM | POA: Diagnosis not present

## 2023-02-08 ENCOUNTER — Ambulatory Visit (INDEPENDENT_AMBULATORY_CARE_PROVIDER_SITE_OTHER): Payer: BC Managed Care – PPO | Admitting: Family Medicine

## 2023-02-08 ENCOUNTER — Encounter: Payer: Self-pay | Admitting: Family Medicine

## 2023-02-08 ENCOUNTER — Ambulatory Visit: Payer: BC Managed Care – PPO | Admitting: Family Medicine

## 2023-02-08 VITALS — BP 134/82 | HR 88 | Temp 98.1°F | Resp 16 | Ht 70.0 in | Wt 314.5 lb

## 2023-02-08 DIAGNOSIS — J0110 Acute frontal sinusitis, unspecified: Secondary | ICD-10-CM

## 2023-02-08 MED ORDER — AZITHROMYCIN 500 MG PO TABS: 500.0000 mg | ORAL_TABLET | Freq: Every day | ORAL | 0 refills | Status: DC

## 2023-02-08 NOTE — Progress Notes (Signed)
   Acute Office Visit  Subjective:     Patient ID: Debra Nolan, female    DOB: 16-Feb-1971, 52 y.o.   MRN: [id number]  Chief Complaint  Patient presents with   Acute Visit    URI    HPI Patient is in today for acute visit.  Pt reports she started with sore throat on Saturday. Sunday felt better but that night, sore throat returned. She then report yesterday, she drove to [place] and had ear pressure/pain with the drive. She reports mild cough but not too bad. No fever or chills. No sick contacts. She did mention cleaning out her attic before her symptoms started.   Review of Systems  HENT:  Positive for congestion, ear pain, sinus pain and sore throat.   All other systems reviewed and are negative.      Objective:    There were no vitals taken for this visit.   Physical Exam Vitals and nursing note reviewed.  Constitutional:      Appearance: Normal appearance. She is obese.  HENT:     Head: Normocephalic and atraumatic.     Right Ear: Tympanic membrane, ear canal and external ear normal.     Left Ear: Tympanic membrane, ear canal and external ear normal.     Nose: Nose normal.     Comments: Boggy nasal turbinates bilaterally R>L     Mouth/Throat:     Mouth: Mucous membranes are moist.     Pharynx: Oropharynx is clear.  Eyes:     Conjunctiva/sclera: Conjunctivae normal.     Pupils: Pupils are equal, round, and reactive to light.  Cardiovascular:     Rate and Rhythm: Normal rate and regular rhythm.     Pulses: Normal pulses.     Heart sounds: Normal heart sounds.  Pulmonary:     Effort: Pulmonary effort is normal.     Breath sounds: Normal breath sounds.  Skin:    General: Skin is warm.     Capillary Refill: Capillary refill takes less than 2 seconds.  Neurological:     General: No focal deficit present.     Mental Status: She is alert and oriented to person, place, and time. Mental status is at baseline.  Psychiatric:        Mood and Affect: Mood normal.         Behavior: Behavior normal.        Thought Content: Thought content normal.        Judgment: Judgment normal.   No results found for any visits on 02/08/23.      Assessment & Plan:   Problem List Items Addressed This Visit   None  Acute non-recurrent frontal sinusitis -     Azithromycin; Take 1 tablet (500 mg total) by mouth daily.  Dispense: 3 tablet; Refill: 0   Symptoms consistent with sinusitis.  Treat with Azithromycin 500mg  daily x 3 days  No orders of the defined types were placed in this encounter.   No follow-ups on file.  Suzan Slick, MD

## 2023-02-13 ENCOUNTER — Ambulatory Visit
Admission: EM | Admit: 2023-02-13 | Discharge: 2023-02-13 | Disposition: A | Discharge status: Home / Self Care | Payer: BC Managed Care – PPO | Attending: Family Medicine | Admitting: Family Medicine

## 2023-02-13 ENCOUNTER — Other Ambulatory Visit: Payer: Self-pay

## 2023-02-13 DIAGNOSIS — M542 Cervicalgia: Secondary | ICD-10-CM

## 2023-02-13 MED ORDER — TIZANIDINE HCL 4 MG PO TABS: 4.0000 mg | ORAL_TABLET | Freq: Four times a day (QID) | ORAL | 0 refills | Status: DC | PRN

## 2023-02-13 MED ORDER — METHYLPREDNISOLONE 4 MG PO TBPK: ORAL_TABLET | ORAL | 0 refills | Status: DC

## 2023-02-13 MED ORDER — HYDROCODONE-ACETAMINOPHEN 7.5-325 MG PO TABS: 1.0000 | ORAL_TABLET | Freq: Four times a day (QID) | ORAL | 0 refills | Status: DC | PRN

## 2023-02-13 NOTE — ED Triage Notes (Addendum)
Has c/o left shoulder/neck pain since yesterday. It is hard to turn neck to the left, also will have sharp pains. Has tried tylenol which helped some.

## 2023-02-13 NOTE — ED Provider Notes (Signed)
[facility]    CSN: [id number] Arrival date & time: 02/13/23  7829      History   Chief Complaint Chief Complaint  Patient presents with   Neck Injury    HPI Debra Nolan is a 52 y.o. female.   Patient states for the last couple days she has had progressively worsening pain on the left side of her neck.  It radiates from her neck into her shoulder region.  She states it hurts with turning her neck and her shoulder.  She did not have any accident or injury.  She states that she has tried ice and ibuprofen.  She had difficulty sleeping last night.  It is very tight today she can hardly turn her head.  She has never had this problem before.  Denies any new activity, accident, injury, or trauma    Past Medical History:  Diagnosis Date   Anxiety    DDD (degenerative disc disease), lumbar    Hypertension    Seasonal allergies     Patient Active Problem List   Diagnosis Date Noted   Anxiety    DDD (degenerative disc disease), lumbar    Hypertension    Seasonal allergies    Chronic left hip pain 12/29/2021   OSA (obstructive sleep apnea) 11/26/2021   Morbid obesity (HCC) 06/19/2020   Generalized anxiety disorder 06/19/2020   Essential hypertension 06/19/2020   Artificial knee joint present 06/19/2020   Allergic rhinitis due to pollen 06/19/2020   Lumbar spondylosis 09/12/2018   Degenerative disc disease, lumbar 09/12/2018   Asthma 09/11/2018   Primary osteoarthritis of left knee 01/27/2018   Primary osteoarthritis of right knee 08/19/2017   Decreased functional activity tolerance 08/05/2017   Chronic pain of left knee 07/04/2017   Arthritis of midfoot 01/22/2016   Foot pain, left 01/22/2016   Pes planus of left foot 11/27/2015   S/P carpal tunnel release 10/31/2015   Bilateral carpal tunnel syndrome 02/23/2015    Past Surgical History:  Procedure Laterality Date   right foot surgery     right knee surgery      OB History   No obstetric history on  file.      Home Medications    Prior to Admission medications   Medication Sig Start Date End Date Taking? Authorizing Provider  HYDROcodone-acetaminophen (NORCO) 7.5-325 MG tablet Take 1 tablet by mouth every 6 (six) hours as needed for moderate pain (pain score 4-6). 02/13/23  Yes Eustace Moore, MD  methylPREDNISolone (MEDROL DOSEPAK) 4 MG TBPK tablet tad 02/13/23  Yes Eustace Moore, MD  tiZANidine (ZANAFLEX) 4 MG tablet Take 1-2 tablets (4-8 mg total) by mouth every 6 (six) hours as needed for muscle spasms. 02/13/23  Yes Eustace Moore, MD  albuterol (VENTOLIN HFA) 108 (90 Base) MCG/ACT inhaler Inhale into the lungs. Patient not taking: Reported on 11/24/2022 01/12/18   [provider]  beclomethasone (QVAR REDIHALER) 80 MCG/ACT inhaler Inhale into the lungs. Patient not taking: Reported on 11/24/2022 01/13/18   [provider]  celecoxib (CELEBREX) 100 MG capsule Take 100 mg by mouth daily.    [provider]  cholecalciferol (VITAMIN D3) 25 MCG (1000 UNIT) tablet Take 2,000 Units by mouth daily.    [provider]  citalopram (CELEXA) 20 MG tablet Take 20 mg by mouth daily. 11/02/19   [provider]  fluticasone (FLONASE) 50 MCG/ACT nasal spray Flonase Allergy Relief 50 mcg/actuation nasal spray,suspension  Inhale 2 sprays every day by intranasal  route.    [provider]  gabapentin (NEURONTIN) 600 MG tablet Take 600 mg by mouth daily. 06/06/19   [provider]  lisinopril-hydrochlorothiazide (ZESTORETIC) 20-12.5 MG tablet Take 0.5 tablets by mouth daily.    [provider]  Multiple Vitamin (THERA) TABS Take 1 tablet by mouth every morning.    [provider]  nystatin-triamcinolone ointment (MYCOLOG) Apply 1 Application topically 2 (two) times daily as needed. 11/24/22   Suzan Slick, MD    Family History Family History  Problem Relation Age of Onset   Osteoarthritis Mother    Healthy  Mother    Healthy Father    Healthy Sister     Social History Social History   Tobacco Use   Smoking status: Never    Passive exposure: Never   Smokeless tobacco: Never  Vaping Use   Vaping status: Never Used  Substance Use Topics   Alcohol use: Yes    Comment: occ   Drug use: Never     Allergies   Patient has no known allergies.   Review of Systems Review of Systems  See HPI Physical Exam Triage Vital Signs ED Triage Vitals  Encounter Vitals Group     BP 02/13/23 0826 (!) 153/84     Systolic BP Percentile --      Diastolic BP Percentile --      Pulse Rate 02/13/23 0826 84     Resp 02/13/23 0826 16     Temp 02/13/23 0826 97.6 F (36.4 C)     Temp Source 02/13/23 0826 Oral     SpO2 02/13/23 0826 97 %     Weight --      Height --      Head Circumference --      Peak Flow --      Pain Score 02/13/23 0829 8     Pain Loc --      Pain Education --      Exclude from Growth Chart --    No data found.  Updated Vital Signs BP (!) 153/84   Pulse 84   Temp 97.6 F (36.4 C) (Oral)   Resp 16   SpO2 97%       Physical Exam Constitutional:      General: She is in acute distress.     Appearance: She is well-developed. She is not ill-appearing.     Comments: Appears acutely uncomfortable with guarded posture and stiff movements  HENT:     Head: Normocephalic and atraumatic.  Eyes:     Conjunctiva/sclera: Conjunctivae normal.     Pupils: Pupils are equal, round, and reactive to light.  Neck:     Comments: Tenderness in the left upper body of the trapezius from the lower C-spine to the shoulder.  Muscle taut.  Limited range of motion secondary to pain.  Upper extremity exam neurovascular intact Cardiovascular:     Rate and Rhythm: Normal rate.  Pulmonary:     Effort: Pulmonary effort is normal. No respiratory distress.  Musculoskeletal:        General: Normal range of motion.     Cervical back: Normal range of motion. Tenderness present.  Skin:     General: Skin is warm and dry.  Neurological:     Mental Status: She is alert.      UC Treatments / Results  Labs (all labs ordered are listed, but only abnormal results are displayed) Labs Reviewed - No data to display  EKG  Radiology No results found.  Procedures Procedures (including critical care time)  Medications Ordered in UC Medications - No data to display  Initial Impression / Assessment and Plan / UC Course  I have reviewed the triage vital signs and the nursing notes.  Pertinent labs & imaging results that were available during my care of the patient were reviewed by me and considered in my medical decision making (see chart for details).     Final Clinical Impressions(s) / UC Diagnoses   Final diagnoses:  Musculoskeletal neck pain     Discharge Instructions      Take the medrol pak as directed This is the steroid anti inflammatory Take the muscle relaxer and pain medication as needed Continue ice or heat to area for comfort See your doctor if not improving by next week   ED Prescriptions     Medication Sig Dispense Auth. Provider   methylPREDNISolone (MEDROL DOSEPAK) 4 MG TBPK tablet tad 21 tablet Eustace Moore, MD   tiZANidine (ZANAFLEX) 4 MG tablet Take 1-2 tablets (4-8 mg total) by mouth every 6 (six) hours as needed for muscle spasms. 21 tablet Eustace Moore, MD   HYDROcodone-acetaminophen [facility]) 7.5-325 MG tablet Take 1 tablet by mouth every 6 (six) hours as needed for moderate pain (pain score 4-6). 10 tablet Eustace Moore, MD      I have reviewed the PDMP during this encounter.   Eustace Moore, MD 02/13/23 [phone]

## 2023-02-13 NOTE — Discharge Instructions (Signed)
Take the medrol pak as directed This is the steroid anti inflammatory Take the muscle relaxer and pain medication as needed Continue ice or heat to area for comfort See your doctor if not improving by next week

## 2023-03-01 DIAGNOSIS — F411 Generalized anxiety disorder: Secondary | ICD-10-CM | POA: Diagnosis not present

## 2023-03-01 DIAGNOSIS — I10 Essential (primary) hypertension: Secondary | ICD-10-CM | POA: Diagnosis not present

## 2023-03-01 DIAGNOSIS — Z713 Dietary counseling and surveillance: Secondary | ICD-10-CM | POA: Diagnosis not present

## 2023-03-01 DIAGNOSIS — E66813 Obesity, class 3: Secondary | ICD-10-CM | POA: Diagnosis not present

## 2023-03-01 DIAGNOSIS — Z6841 Body Mass Index (BMI) 40.0 and over, adult: Secondary | ICD-10-CM | POA: Diagnosis not present

## 2023-03-14 DIAGNOSIS — F411 Generalized anxiety disorder: Secondary | ICD-10-CM | POA: Diagnosis not present

## 2023-04-06 DIAGNOSIS — Z6841 Body Mass Index (BMI) 40.0 and over, adult: Secondary | ICD-10-CM | POA: Diagnosis not present

## 2023-04-06 DIAGNOSIS — Z713 Dietary counseling and surveillance: Secondary | ICD-10-CM | POA: Diagnosis not present

## 2023-04-06 DIAGNOSIS — G4733 Obstructive sleep apnea (adult) (pediatric): Secondary | ICD-10-CM | POA: Diagnosis not present

## 2023-04-06 DIAGNOSIS — E66813 Obesity, class 3: Secondary | ICD-10-CM | POA: Diagnosis not present

## 2023-04-17 ENCOUNTER — Ambulatory Visit
Admission: RE | Admit: 2023-04-17 | Discharge: 2023-04-17 | Disposition: A | Discharge status: Home / Self Care | Payer: BC Managed Care – PPO | Source: Ambulatory Visit | Attending: Family Medicine | Admitting: Family Medicine

## 2023-04-17 ENCOUNTER — Other Ambulatory Visit: Payer: Self-pay

## 2023-04-17 VITALS — BP 133/81 | HR 71 | Temp 98.6°F | Resp 18 | Ht 70.0 in | Wt 310.0 lb

## 2023-04-17 DIAGNOSIS — R059 Cough, unspecified: Secondary | ICD-10-CM

## 2023-04-17 LAB — RESP PANEL BY RT-PCR (RSV, FLU A&B, COVID)  RVPGX2
Influenza A by PCR: POSITIVE — AB
Influenza B by PCR: NEGATIVE
Resp Syncytial Virus by PCR: NEGATIVE
SARS Coronavirus 2 by RT PCR: NEGATIVE

## 2023-04-17 MED ORDER — PREDNISONE 20 MG PO TABS: ORAL_TABLET | ORAL | 0 refills | Status: DC

## 2023-04-17 MED ORDER — PROMETHAZINE-DM 6.25-15 MG/5ML PO SYRP: 5.0000 mL | ORAL_SOLUTION | Freq: Two times a day (BID) | ORAL | 0 refills | Status: DC | PRN

## 2023-04-17 MED ORDER — BENZONATATE 200 MG PO CAPS: 200.0000 mg | ORAL_CAPSULE | Freq: Three times a day (TID) | ORAL | 0 refills | Status: AC | PRN

## 2023-04-17 NOTE — ED Provider Notes (Signed)
 [facility]    CSN: [id number] Arrival date & time: 04/17/23  1348      History   Chief Complaint Chief Complaint  Patient presents with   Cough    Entered by patient    HPI Debra Nolan is a 53 y.o. female.   HPI 54 year old female presents with cough, chest congestion, and loss of voice for 2 days  days.  PMH significant for morbid obesity, HTN, and DDD.  Past Medical History:  Diagnosis Date   Anxiety    DDD (degenerative disc disease), lumbar    Hypertension    Seasonal allergies     Patient Active Problem List   Diagnosis Date Noted   Anxiety    DDD (degenerative disc disease), lumbar    Hypertension    Seasonal allergies    Chronic left hip pain 12/29/2021   OSA (obstructive sleep apnea) 11/26/2021   Morbid obesity (HCC) 06/19/2020   Generalized anxiety disorder 06/19/2020   Essential hypertension 06/19/2020   Artificial knee joint present 06/19/2020   Allergic rhinitis due to pollen 06/19/2020   Lumbar spondylosis 09/12/2018   Degenerative disc disease, lumbar 09/12/2018   Asthma 09/11/2018   Primary osteoarthritis of left knee 01/27/2018   Primary osteoarthritis of right knee 08/19/2017   Decreased functional activity tolerance 08/05/2017   Chronic pain of left knee 07/04/2017   Arthritis of midfoot 01/22/2016   Foot pain, left 01/22/2016   Pes planus of left foot 11/27/2015   S/P carpal tunnel release 10/31/2015   Bilateral carpal tunnel syndrome 02/23/2015    Past Surgical History:  Procedure Laterality Date   right foot surgery     right knee surgery      OB History   No obstetric history on file.      Home Medications    Prior to Admission medications   Medication Sig Start Date End Date Taking? Authorizing Provider  benzonatate  (TESSALON ) 200 MG capsule Take 1 capsule (200 mg total) by mouth 3 (three) times daily as needed for up to 7 days. 04/17/23 04/24/23 Yes Teddy Sharper, FNP  celecoxib  (CELEBREX ) 100 MG capsule  Take 100 mg by mouth daily.   Yes [provider]  cholecalciferol (VITAMIN D3) 25 MCG (1000 UNIT) tablet Take 2,000 Units by mouth daily.   Yes [provider]  citalopram  (CELEXA ) 20 MG tablet Take 20 mg by mouth daily. 11/02/19  Yes [provider]  fluticasone (FLONASE) 50 MCG/ACT nasal spray Flonase Allergy Relief 50 mcg/actuation nasal spray,suspension  Inhale 2 sprays every day by intranasal route.   Yes [provider]  gabapentin  (NEURONTIN ) 600 MG tablet Take 600 mg by mouth daily. 06/06/19  Yes [provider]  lisinopril -hydrochlorothiazide  (ZESTORETIC ) 20-12.5 MG tablet Take 0.5 tablets by mouth daily.   Yes [provider]  Multiple Vitamin (THERA) TABS Take 1 tablet by mouth every morning.   Yes [provider]  nystatin -triamcinolone  ointment (MYCOLOG) Apply 1 Application topically 2 (two) times daily as needed. 11/24/22  Yes Rucker, Torrence GRADE, MD  predniSONE  (DELTASONE ) 20 MG tablet Take 3 tabs PO daily x 5 days. 04/17/23  Yes Teddy Sharper, FNP  promethazine -dextromethorphan (PROMETHAZINE -DM) 6.25-15 MG/5ML syrup Take 5 mLs by mouth 2 (two) times daily as needed for cough. 04/17/23  Yes Teddy Sharper, FNP  tiZANidine  (ZANAFLEX ) 4 MG tablet Take 1-2 tablets (4-8 mg total) by mouth every 6 (six) hours as needed for muscle spasms. 02/13/23  Yes Maranda Jamee Jacob, MD  albuterol  (VENTOLIN  HFA)  108 (90 Base) MCG/ACT inhaler Inhale into the lungs. Patient not taking: Reported on 11/24/2022 01/12/18   [provider]  beclomethasone (QVAR REDIHALER) 80 MCG/ACT inhaler Inhale into the lungs. Patient not taking: Reported on 11/24/2022 01/13/18   [provider]  HYDROcodone -acetaminophen  (NORCO) 7.5-325 MG tablet Take 1 tablet by mouth every 6 (six) hours as needed for moderate pain (pain score 4-6). 02/13/23   Maranda Jamee Jacob, MD    Family History Family History  Problem Relation Age of Onset   Osteoarthritis  Mother    Healthy Mother    Healthy Father    Healthy Sister     Social History Social History   Tobacco Use   Smoking status: Never    Passive exposure: Never   Smokeless tobacco: Never  Vaping Use   Vaping status: Never Used  Substance Use Topics   Alcohol use: Yes    Comment: occ   Drug use: Never     Allergies   Hydrocodone    Review of Systems Review of Systems  Respiratory:  Positive for cough.      Physical Exam Triage Vital Signs ED Triage Vitals  Encounter Vitals Group     BP      Systolic BP Percentile      Diastolic BP Percentile      Pulse      Resp      Temp      Temp src      SpO2      Weight      Height      Head Circumference      Peak Flow      Pain Score      Pain Loc      Pain Education      Exclude from Growth Chart    No data found.  Updated Vital Signs BP 133/81 (BP Location: Left Arm)   Pulse 71   Temp 98.6 F (37 C) (Oral)   Resp 18   Ht 5' 10 (1.778 m)   Wt (!) 310 lb (140.6 kg)   SpO2 99%   BMI 44.48 kg/m    Physical Exam Vitals and nursing note reviewed.  Constitutional:      Appearance: Normal appearance. She is normal weight.  HENT:     Right Ear: Tympanic membrane, ear canal and external ear normal.     Left Ear: Tympanic membrane, ear canal and external ear normal.     Mouth/Throat:     Mouth: Mucous membranes are moist.     Pharynx: Oropharynx is clear.  Eyes:     Extraocular Movements: Extraocular movements intact.     Conjunctiva/sclera: Conjunctivae normal.     Pupils: Pupils are equal, round, and reactive to light.  Cardiovascular:     Rate and Rhythm: Normal rate and regular rhythm.     Pulses: Normal pulses.     Heart sounds: Normal heart sounds.  Pulmonary:     Effort: Pulmonary effort is normal.     Breath sounds: Normal breath sounds. No wheezing, rhonchi or rales.  Musculoskeletal:        General: Normal range of motion.     Cervical back: Normal range of motion and neck supple.   Skin:    General: Skin is warm and dry.  Neurological:     General: No focal deficit present.     Mental Status: She is alert and oriented to person, place, and time. Mental status is at baseline.  Psychiatric:        Mood and Affect: Mood normal.        Behavior: Behavior normal.      UC Treatments / Results  Labs (all labs ordered are listed, but only abnormal results are displayed) Labs Reviewed  RESP PANEL BY RT-PCR (RSV, FLU A&B, COVID)  [id number]    EKG   Radiology No results found.  Procedures Procedures (including critical care time)  Medications Ordered in UC Medications - No data to display  Initial Impression / Assessment and Plan / UC Course  I have reviewed the triage vital signs and the nursing notes.  Pertinent labs & imaging results that were available during my care of the patient were reviewed by me and considered in my medical decision making (see chart for details).     MDM: 1.  Cough, unspecified type-respiratory panel ordered, Rx'd prednisone  20 mg tablet: Take 3 tabs p.o. daily x 5 days, Rx'd Promethazine  DM 6.25-15 Mg/5 mL syrup: Take 2 mL twice daily, as needed for cough, Rx'd Tessalon  200 mg capsules: Take 1 capsule 3 times daily, as needed for cough. Advised patient to take medication as directed with food to completion.  Advised may use Tessalon  capsules daily or as needed for cough.  Advised may use Promethazine  DM at night for cough prior to sleep due to sedative effects.  Encouraged to increase daily water intake to 64 ounces per day while taking these medications.  Advised we will follow-up with respiratory panel results once received advised if symptoms worsen and/or unresolved please follow-up with your PCP or here for further evaluation.  Patient discharged home, hemodynamically stable. Final Clinical Impressions(s) / UC Diagnoses   Final diagnoses:  Cough, unspecified type     Discharge Instructions      Advised patient to take  medication as directed with food to completion.  Advised may use Tessalon  capsules daily or as needed for cough.  Advised may use Promethazine  DM at night for cough prior to sleep due to sedative effects.  Encouraged to increase daily water intake to 64 ounces per day while taking these medications.  Advised we will follow-up with respiratory panel results once received advised if symptoms worsen and/or unresolved please follow-up with your PCP or here for further evaluation.     ED Prescriptions     Medication Sig Dispense Auth. Provider   predniSONE  (DELTASONE ) 20 MG tablet Take 3 tabs PO daily x 5 days. 15 tablet Punam Broussard, FNP   benzonatate  (TESSALON ) 200 MG capsule Take 1 capsule (200 mg total) by mouth 3 (three) times daily as needed for up to 7 days. 40 capsule Santa Abdelrahman, FNP   promethazine -dextromethorphan (PROMETHAZINE -DM) 6.25-15 MG/5ML syrup Take 5 mLs by mouth 2 (two) times daily as needed for cough. 118 mL Garrell Flagg, FNP      PDMP not reviewed this encounter.   Teddy Sharper, FNP 04/17/23 1525

## 2023-04-17 NOTE — ED Triage Notes (Signed)
 X2 days  Pt states that she has a cough, chest congestion and loss of voice.

## 2023-04-17 NOTE — Discharge Instructions (Addendum)
 Advised patient to take medication as directed with food to completion.  Advised may use Tessalon  capsules daily or as needed for cough.  Advised may use Promethazine  DM at night for cough prior to sleep due to sedative effects.  Encouraged to increase daily water intake to 64 ounces per day while taking these medications.  Advised we will follow-up with respiratory panel results once received advised if symptoms worsen and/or unresolved please follow-up with your PCP or here for further evaluation.

## 2023-04-18 ENCOUNTER — Encounter: Payer: Self-pay | Admitting: Family Medicine

## 2023-04-18 DIAGNOSIS — J101 Influenza due to other identified influenza virus with other respiratory manifestations: Secondary | ICD-10-CM

## 2023-04-18 MED ORDER — OSELTAMIVIR PHOSPHATE 75 MG PO CAPS: 75.0000 mg | ORAL_CAPSULE | Freq: Two times a day (BID) | ORAL | 0 refills | Status: DC

## 2023-05-02 ENCOUNTER — Other Ambulatory Visit: Payer: Self-pay | Admitting: Family Medicine

## 2023-05-03 ENCOUNTER — Ambulatory Visit (INDEPENDENT_AMBULATORY_CARE_PROVIDER_SITE_OTHER): Payer: BC Managed Care – PPO | Admitting: Family Medicine

## 2023-05-03 ENCOUNTER — Other Ambulatory Visit: Payer: Self-pay | Admitting: Family Medicine

## 2023-05-03 VITALS — BP 137/78 | HR 81 | Temp 98.1°F | Resp 18 | Ht 70.0 in | Wt 315.4 lb

## 2023-05-03 DIAGNOSIS — R7302 Impaired glucose tolerance (oral): Secondary | ICD-10-CM

## 2023-05-03 DIAGNOSIS — F411 Generalized anxiety disorder: Secondary | ICD-10-CM

## 2023-05-03 DIAGNOSIS — Z136 Encounter for screening for cardiovascular disorders: Secondary | ICD-10-CM

## 2023-05-03 DIAGNOSIS — Z0000 Encounter for general adult medical examination without abnormal findings: Secondary | ICD-10-CM

## 2023-05-03 DIAGNOSIS — Z1321 Encounter for screening for nutritional disorder: Secondary | ICD-10-CM

## 2023-05-03 DIAGNOSIS — Z13220 Encounter for screening for lipoid disorders: Secondary | ICD-10-CM

## 2023-05-03 DIAGNOSIS — Z1231 Encounter for screening mammogram for malignant neoplasm of breast: Secondary | ICD-10-CM | POA: Diagnosis not present

## 2023-05-03 DIAGNOSIS — J301 Allergic rhinitis due to pollen: Secondary | ICD-10-CM

## 2023-05-03 DIAGNOSIS — M47816 Spondylosis without myelopathy or radiculopathy, lumbar region: Secondary | ICD-10-CM

## 2023-05-03 DIAGNOSIS — I10 Essential (primary) hypertension: Secondary | ICD-10-CM

## 2023-05-03 MED ORDER — GABAPENTIN 600 MG PO TABS: 600.0000 mg | ORAL_TABLET | Freq: Every day | ORAL | 1 refills | Status: DC

## 2023-05-03 MED ORDER — ALBUTEROL SULFATE HFA 108 (90 BASE) MCG/ACT IN AERS: 2.0000 | INHALATION_SPRAY | Freq: Four times a day (QID) | RESPIRATORY_TRACT | 5 refills | Status: AC | PRN

## 2023-05-03 MED ORDER — LISINOPRIL-HYDROCHLOROTHIAZIDE 20-12.5 MG PO TABS: 0.5000 | ORAL_TABLET | Freq: Every day | ORAL | 1 refills | Status: DC

## 2023-05-03 MED ORDER — CELECOXIB 100 MG PO CAPS: 100.0000 mg | ORAL_CAPSULE | Freq: Every day | ORAL | 1 refills | Status: DC | PRN

## 2023-05-03 MED ORDER — CITALOPRAM HYDROBROMIDE 20 MG PO TABS: 20.0000 mg | ORAL_TABLET | Freq: Every day | ORAL | 1 refills | Status: DC

## 2023-05-03 NOTE — Progress Notes (Signed)
 Complete physical exam  Patient: Debra Nolan   DOB: 08-07-1970   53 y.o. Female  MRN: [id number]  Subjective:    Chief Complaint  Patient presents with   Annual Exam    Patient is here for her annual physical patient is fasting this morning    Debra Nolan is a 53 y.o. female who presents today for a complete physical exam. She reports consuming a  corelife  diet.  walking  She generally feels well. She reports sleeping well. She does not have additional problems to discuss today.  Pt is up to date with pap smear. Needs order for mammogram. Pt requests Vitamin D and B12 levels today.  Most recent fall risk assessment:    11/24/2022    8:32 AM  Fall Risk   Falls in the past year? 1  Injury with Fall? 1  Risk for fall due to : History of fall(s)  Follow up Falls evaluation completed     Most recent depression screenings:    11/24/2022    8:32 AM  PHQ 2/9 Scores  PHQ - 2 Score 0  PHQ- 9 Score 3    Vision:Within last year  Patient Active Problem List   Diagnosis Date Noted   Anxiety    DDD (degenerative disc disease), lumbar    Hypertension    Seasonal allergies    Chronic left hip pain 12/29/2021   OSA (obstructive sleep apnea) 11/26/2021   Morbid obesity (HCC) 06/19/2020   Generalized anxiety disorder 06/19/2020   Essential hypertension 06/19/2020   Artificial knee joint present 06/19/2020   Allergic rhinitis due to pollen 06/19/2020   Lumbar spondylosis 09/12/2018   Degenerative disc disease, lumbar 09/12/2018   Asthma 09/11/2018   Primary osteoarthritis of left knee 01/27/2018   Primary osteoarthritis of right knee 08/19/2017   Decreased functional activity tolerance 08/05/2017   Chronic pain of left knee 07/04/2017   Arthritis of midfoot 01/22/2016   Foot pain, left 01/22/2016   Pes planus of left foot 11/27/2015   S/P carpal tunnel release 10/31/2015   Bilateral carpal tunnel syndrome 02/23/2015   Past Medical History:  Diagnosis Date   Allergy     Seasonal   Anxiety    DDD (degenerative disc disease), lumbar    Hypertension    Seasonal allergies    Sleep apnea    Past Surgical History:  Procedure Laterality Date   APPENDECTOMY  2018   FRACTURE SURGERY     Right foot   JOINT REPLACEMENT     Knees   right foot surgery     right knee surgery     Social History   Socioeconomic History   Marital status: Divorced    Spouse name: Not on file   Number of children: Not on file   Years of education: Not on file   Highest education level: Bachelor's degree (e.g., BA, AB, BS)  Occupational History   Not on file  Tobacco Use   Smoking status: Never    Passive exposure: Never   Smokeless tobacco: Never  Vaping Use   Vaping status: Never Used  Substance and Sexual Activity   Alcohol use: Yes    Comment: occ   Drug use: Never   Sexual activity: Yes    Birth control/protection: Condom  Other Topics Concern   Not on file  Social History Narrative   Not on file   Social Drivers of Health   Financial Resource Strain: Low Risk  (05/03/2023)   Overall  Financial Resource Strain (CARDIA)    Difficulty of Paying Living Expenses: Not very hard  Food Insecurity: No Food Insecurity (05/03/2023)   Hunger Vital Sign    Worried About Running Out of Food in the Last Year: Never true    Ran Out of Food in the Last Year: Never true  Transportation Needs: No Transportation Needs (05/03/2023)   PRAPARE - Administrator, Civil Service (Medical): No    Lack of Transportation (Non-Medical): No  Physical Activity: Insufficiently Active (05/03/2023)   Exercise Vital Sign    Days of Exercise per Week: 3 days    Minutes of Exercise per Session: 30 min  Stress: Stress Concern Present (05/03/2023)   [employer] of Occupational Health - Occupational Stress Questionnaire    Feeling of Stress : Rather much  Social Connections: Moderately Isolated (05/03/2023)   Social Connection and Isolation Panel [NHANES]    Frequency of  Communication with Friends and Family: More than three times a week    Frequency of Social Gatherings with Friends and Family: Once a week    Attends Religious Services: 1 to 4 times per year    Active Member of [employer] or Organizations: No    Attends Engineer, structural: Not on file    Marital Status: Divorced  Intimate Partner Violence: Not At Risk (01/18/2023)   Received from [facility]   HITS    Over the last 12 months how often did your partner physically hurt you?: Never    Over the last 12 months how often did your partner insult you or talk down to you?: Never    Over the last 12 months how often did your partner threaten you with physical harm?: Never    Over the last 12 months how often did your partner scream or curse at you?: Never   Family History  Problem Relation Age of Onset   Osteoarthritis Mother    Healthy Mother    Healthy Father    Healthy Sister       Patient Care Team: Suzan Slick, MD as PCP - General (Family Medicine)   Outpatient Medications Prior to Visit  Medication Sig   albuterol (VENTOLIN HFA) 108 (90 Base) MCG/ACT inhaler Inhale into the lungs.   beclomethasone (QVAR REDIHALER) 80 MCG/ACT inhaler Inhale into the lungs.   celecoxib (CELEBREX) 100 MG capsule Take 100 mg by mouth daily.   cholecalciferol (VITAMIN D3) 25 MCG (1000 UNIT) tablet Take 2,000 Units by mouth daily.   citalopram (CELEXA) 20 MG tablet Take 20 mg by mouth daily.   fluticasone (FLONASE) 50 MCG/ACT nasal spray Flonase Allergy Relief 50 mcg/actuation nasal spray,suspension  Inhale 2 sprays every day by intranasal route.   gabapentin (NEURONTIN) 600 MG tablet Take 600 mg by mouth daily.   HYDROcodone-acetaminophen (NORCO) 7.5-325 MG tablet Take 1 tablet by mouth every 6 (six) hours as needed for moderate pain (pain score 4-6).   lisinopril-hydrochlorothiazide (ZESTORETIC) 20-12.5 MG tablet Take 0.5 tablets by mouth daily.   Multiple Vitamin (THERA) TABS Take 1  tablet by mouth every morning.   nystatin-triamcinolone ointment (MYCOLOG) Apply 1 Application topically 2 (two) times daily as needed.   oseltamivir (TAMIFLU) 75 MG capsule Take 1 capsule (75 mg total) by mouth 2 (two) times daily.   predniSONE (DELTASONE) 20 MG tablet Take 3 tabs PO daily x 5 days.   promethazine-dextromethorphan (PROMETHAZINE-DM) 6.25-15 MG/5ML syrup Take 5 mLs by mouth 2 (two) times daily as needed for cough.  tirzepatide (ZEPBOUND) 2.5 MG/0.5ML Pen Inject 2.5 mg into the skin once a week.   tiZANidine (ZANAFLEX) 4 MG tablet Take 1-2 tablets (4-8 mg total) by mouth every 6 (six) hours as needed for muscle spasms.   No facility-administered medications prior to visit.    Review of Systems  All other systems reviewed and are negative.        Objective:     BP 137/78   Pulse 81   Temp 98.1 F (36.7 C) (Oral)   Resp 18   Ht 5\' 10"  (1.778 m)   Wt (!) 315 lb 6.4 oz (143.1 kg)   SpO2 96%   BMI 45.26 kg/m  BP Readings from Last 3 Encounters:  05/03/23 137/78  04/17/23 133/81  02/13/23 (!) 153/84      Physical Exam Vitals and nursing note reviewed.  Constitutional:      Appearance: Normal appearance. She is obese.  HENT:     Head: Normocephalic and atraumatic.     Right Ear: Tympanic membrane, ear canal and external ear normal.     Left Ear: Tympanic membrane, ear canal and external ear normal.     Nose: Nose normal.     Mouth/Throat:     Mouth: Mucous membranes are moist.     Pharynx: Oropharynx is clear.  Eyes:     Conjunctiva/sclera: Conjunctivae normal.     Pupils: Pupils are equal, round, and reactive to light.  Cardiovascular:     Rate and Rhythm: Normal rate and regular rhythm.     Pulses: Normal pulses.     Heart sounds: Normal heart sounds.  Pulmonary:     Effort: Pulmonary effort is normal.     Breath sounds: Normal breath sounds.  Abdominal:     General: Abdomen is flat. Bowel sounds are normal.  Skin:    General: Skin is warm.      Capillary Refill: Capillary refill takes less than 2 seconds.  Neurological:     General: No focal deficit present.     Mental Status: She is alert and oriented to person, place, and time. Mental status is at baseline.  Psychiatric:        Mood and Affect: Mood normal.        Behavior: Behavior normal.        Thought Content: Thought content normal.        Judgment: Judgment normal.     No results found for any visits on 05/03/23. Last CBC No results found for: "WBC", "HGB", "HCT", "MCV", "MCH", "RDW", "PLT" Last metabolic panel No results found for: "GLUCOSE", "NA", "K", "CL", "CO2", "BUN", "CREATININE", "EGFR", "CALCIUM", "PHOS", "PROT", "ALBUMIN", "LABGLOB", "AGRATIO", "BILITOT", "ALKPHOS", "AST", "ALT", "ANIONGAP" Last lipids No results found for: "CHOL", "HDL", "LDLCALC", "LDLDIRECT", "TRIG", "CHOLHDL" Last hemoglobin A1c No results found for: "HGBA1C"      Assessment & Plan:    Routine Health Maintenance and Physical Exam  Immunization History  Administered Date(s) Administered   Influenza, Seasonal, Injecte, Preservative Fre 11/24/2022   Influenza-Unspecified 11/13/2016, 12/06/2016, 11/26/2021   Moderna Sars-Covid-2 Vaccination 05/08/2019, 06/06/2019, 01/01/2020, 10/11/2020, 11/28/2021   PNEUMOCOCCAL CONJUGATE-20 04/29/2022   Tdap 01/24/2022   Unspecified SARS-COV-2 Vaccination 05/08/2019, 06/06/2019, 01/01/2020, 10/11/2020, 12/13/2020   Zoster Recombinant(Shingrix) 12/19/2019, 02/23/2020    Health Maintenance  Topic Date Due   HIV Screening  Never done   Hepatitis C Screening  Never done   Colonoscopy  Never done   MAMMOGRAM  Never done   COVID-19 Vaccine (11 - 2024-25 season)  11/07/2022   Cervical Cancer Screening (HPV/Pap Cotest)  04/30/2027   DTaP/Tdap/Td (2 - Td or Tdap) 01/25/2032   Pneumococcal Vaccine 68-81 Years old  Completed   INFLUENZA VACCINE  Completed   Zoster Vaccines- Shingrix  Completed   HPV VACCINES  Aged Out    Discussed health  benefits of physical activity, and encouraged her to engage in regular exercise appropriate for her age and condition.  Problem List Items Addressed This Visit   None  No follow-ups on file. Annual physical exam  Encounter for lipid screening for cardiovascular disease -     Lipid panel  Impaired glucose tolerance -     CBC with Differential/Platelet -     Comprehensive metabolic panel -     Hemoglobin A1c  Screening mammogram for breast cancer -     3D Screening Mammogram, Left and Right; Future  Encounter for vitamin deficiency screening -     Vitamin B12 -     VITAMIN D 25 Hydroxy (Vit-D Deficiency, Fractures)   Screening labs including Vitamin D and B12 per pt's request. Refer for mammogram. See in 6 months sooner prn.    Suzan Slick, MD

## 2023-05-04 ENCOUNTER — Encounter: Payer: Self-pay | Admitting: Family Medicine

## 2023-05-04 DIAGNOSIS — R632 Polyphagia: Secondary | ICD-10-CM | POA: Diagnosis not present

## 2023-05-04 DIAGNOSIS — E66813 Obesity, class 3: Secondary | ICD-10-CM | POA: Diagnosis not present

## 2023-05-04 DIAGNOSIS — Z6841 Body Mass Index (BMI) 40.0 and over, adult: Secondary | ICD-10-CM | POA: Diagnosis not present

## 2023-05-04 LAB — COMPREHENSIVE METABOLIC PANEL
ALT: 19 IU/L (ref 0–32)
AST: 21 IU/L (ref 0–40)
Albumin: 4 g/dL (ref 3.8–4.9)
Alkaline Phosphatase: 59 IU/L (ref 44–121)
BUN/Creatinine Ratio: 13 (ref 9–23)
BUN: 11 mg/dL (ref 6–24)
Bilirubin Total: 0.4 mg/dL (ref 0.0–1.2)
CO2: 23 mmol/L (ref 20–29)
Calcium: 9.1 mg/dL (ref 8.7–10.2)
Chloride: 105 mmol/L (ref 96–106)
Creatinine, Ser: 0.83 mg/dL (ref 0.57–1.00)
Globulin, Total: 2.4 g/dL (ref 1.5–4.5)
Glucose: 91 mg/dL (ref 70–99)
Potassium: 4.3 mmol/L (ref 3.5–5.2)
Sodium: 142 mmol/L (ref 134–144)
Total Protein: 6.4 g/dL (ref 6.0–8.5)
eGFR: 84 mL/min/{1.73_m2} (ref >59)

## 2023-05-04 LAB — CBC WITH DIFFERENTIAL/PLATELET
Basophils Absolute: 0 10*3/uL (ref 0.0–0.2)
Basos: 1 %
EOS (ABSOLUTE): 0.1 10*3/uL (ref 0.0–0.4)
Eos: 2 %
Hematocrit: 39.8 % (ref 34.0–46.6)
Hemoglobin: 13.1 g/dL (ref 11.1–15.9)
Immature Grans (Abs): 0 10*3/uL (ref 0.0–0.1)
Immature Granulocytes: 0 %
Lymphocytes Absolute: 1.7 10*3/uL (ref 0.7–3.1)
Lymphs: 20 %
MCH: 30.3 pg (ref 26.6–33.0)
MCHC: 32.9 g/dL (ref 31.5–35.7)
MCV: 92 fL (ref 79–97)
Monocytes Absolute: 0.4 10*3/uL (ref 0.1–0.9)
Monocytes: 5 %
Neutrophils Absolute: 5.9 10*3/uL (ref 1.4–7.0)
Neutrophils: 72 %
Platelets: 234 10*3/uL (ref 150–450)
RBC: 4.33 x10E6/uL (ref 3.77–5.28)
RDW: 13.2 % (ref 11.7–15.4)
WBC: 8.1 10*3/uL (ref 3.4–10.8)

## 2023-05-04 LAB — LIPID PANEL
Chol/HDL Ratio: 3.1 ratio (ref 0.0–4.4)
Cholesterol, Total: 170 mg/dL (ref 100–199)
HDL: 55 mg/dL (ref >39)
LDL Chol Calc (NIH): 89 mg/dL (ref 0–99)
Triglycerides: 150 mg/dL — ABNORMAL HIGH (ref 0–149)
VLDL Cholesterol Cal: 26 mg/dL (ref 5–40)

## 2023-05-04 LAB — VITAMIN B12: Vitamin B-12: 523 pg/mL (ref 232–1245)

## 2023-05-04 LAB — HEMOGLOBIN A1C
Est. average glucose Bld gHb Est-mCnc: 117 mg/dL
Hgb A1c MFr Bld: 5.7 % — ABNORMAL HIGH (ref 4.8–5.6)

## 2023-05-04 LAB — VITAMIN D 25 HYDROXY (VIT D DEFICIENCY, FRACTURES): Vit D, 25-Hydroxy: 31.3 ng/mL (ref 30.0–100.0)

## 2023-05-23 DIAGNOSIS — F411 Generalized anxiety disorder: Secondary | ICD-10-CM | POA: Diagnosis not present

## 2023-06-29 ENCOUNTER — Encounter: Payer: Self-pay | Admitting: Family Medicine

## 2023-06-30 ENCOUNTER — Other Ambulatory Visit: Payer: Self-pay

## 2023-06-30 DIAGNOSIS — M47816 Spondylosis without myelopathy or radiculopathy, lumbar region: Secondary | ICD-10-CM

## 2023-06-30 MED ORDER — GABAPENTIN 600 MG PO TABS: 600.0000 mg | ORAL_TABLET | Freq: Every day | ORAL | 1 refills | Status: DC

## 2023-06-30 NOTE — Telephone Encounter (Signed)
 Patients request for Medication refill was done and sent to requested pharmacy.

## 2023-08-04 ENCOUNTER — Ambulatory Visit (INDEPENDENT_AMBULATORY_CARE_PROVIDER_SITE_OTHER): Admitting: Family Medicine

## 2023-08-04 ENCOUNTER — Encounter: Payer: Self-pay | Admitting: Family Medicine

## 2023-08-04 VITALS — BP 121/77 | HR 69 | Temp 97.7°F | Resp 18 | Ht 70.0 in | Wt 319.5 lb

## 2023-08-04 DIAGNOSIS — M5030 Other cervical disc degeneration, unspecified cervical region: Secondary | ICD-10-CM

## 2023-08-04 DIAGNOSIS — S161XXD Strain of muscle, fascia and tendon at neck level, subsequent encounter: Secondary | ICD-10-CM | POA: Diagnosis not present

## 2023-08-04 MED ORDER — BACLOFEN 10 MG PO TABS: 10.0000 mg | ORAL_TABLET | Freq: Three times a day (TID) | ORAL | 0 refills | Status: DC | PRN

## 2023-08-04 NOTE — Progress Notes (Signed)
 Established Patient Office Visit  Subjective   Patient ID: Debra Nolan, female    DOB: Oct 12, 1970  Age: 53 y.o. MRN: [id number]  Chief Complaint  Patient presents with   Neck Pain    Patient states that she pulled a muscle in her neck and shoulder on the left side she states that she agitated it on Thursday of last week. She states that she took a muscle relaxer that she had from prior treatment in December at the Urgent care that worked but states while moving boxes Monday she hurt the same side again.     Neck Pain   Neck pain Pt reports she was putting together some furniture back in December. She injured her left shoulder and neck. She went to urgent care and was given Hydrocodone , prednisone , and tizanidine . She reports this improved up until last Thursday, she was moving tuba instruments and felt like she aggravated the same area on the left. She feels tightness across the back of her neck. She had some Tizanidine  left and took one which helped with the tightness. She reports a hx of DDD of cervical neck that she was diagnosed many years ago.   Review of Systems  Musculoskeletal:  Positive for neck pain.  All other systems reviewed and are negative.    Objective:     BP 121/77   Pulse 69   Temp 97.7 F (36.5 C) (Oral)   Resp 18   Ht 5\' 10"  (1.778 m)   Wt (!) 319 lb 8 oz (144.9 kg)   SpO2 98%   BMI 45.84 kg/m  BP Readings from Last 3 Encounters:  08/04/23 121/77  05/03/23 137/78  04/17/23 133/81      Physical Exam Vitals and nursing note reviewed.  Constitutional:      Appearance: Normal appearance. She is normal weight.  HENT:     Head: Normocephalic and atraumatic.     Right Ear: External ear normal.     Left Ear: External ear normal.     Nose: Nose normal.     Mouth/Throat:     Mouth: Mucous membranes are moist.     Pharynx: Oropharynx is clear.  Eyes:     Conjunctiva/sclera: Conjunctivae normal.     Pupils: Pupils are equal, round, and reactive to  light.  Cardiovascular:     Rate and Rhythm: Normal rate.  Pulmonary:     Effort: Pulmonary effort is normal.  Musculoskeletal:     Cervical back: Normal range of motion.  Skin:    General: Skin is warm.     Capillary Refill: Capillary refill takes less than 2 seconds.  Neurological:     General: No focal deficit present.     Mental Status: She is alert and oriented to person, place, and time. Mental status is at baseline.  Psychiatric:        Mood and Affect: Mood normal.        Behavior: Behavior normal.        Thought Content: Thought content normal.        Judgment: Judgment normal.    No results found for any visits on 08/04/23.     The 10-year ASCVD risk score (Arnett DK, et al., 2019) is: 1.8%    Assessment & Plan:   Problem List Items Addressed This Visit   None  Strain of neck muscle, subsequent encounter -     Baclofen; Take 1 tablet (10 mg total) by mouth 3 (three) times daily as  needed for muscle spasms.  Dispense: 30 each; Refill: 0  DDD (degenerative disc disease), cervical   Pt with hx of DDD of cervical region, with cervical strain back in December with acute exacerbation.  To start Baclofen 10mg  TID prn since Tizanidine  made her sleepy May use OTC nsaids and heating pads PT exercises given for pt  No follow-ups on file.    Manette Section, MD

## 2023-08-15 ENCOUNTER — Ambulatory Visit (INDEPENDENT_AMBULATORY_CARE_PROVIDER_SITE_OTHER): Admitting: Family Medicine

## 2023-08-15 ENCOUNTER — Encounter: Payer: Self-pay | Admitting: Family Medicine

## 2023-08-15 VITALS — BP 121/78 | HR 80 | Temp 97.8°F | Resp 18 | Ht 70.0 in | Wt 321.0 lb

## 2023-08-15 DIAGNOSIS — L304 Erythema intertrigo: Secondary | ICD-10-CM

## 2023-08-15 MED ORDER — NYSTATIN 100000 UNIT/GM EX OINT: 1.0000 | TOPICAL_OINTMENT | Freq: Two times a day (BID) | CUTANEOUS | 5 refills | Status: AC | PRN

## 2023-08-15 MED ORDER — MUPIROCIN 2 % EX OINT: 1.0000 | TOPICAL_OINTMENT | Freq: Two times a day (BID) | CUTANEOUS | 5 refills | Status: AC | PRN

## 2023-08-15 NOTE — Progress Notes (Signed)
   Acute Office Visit  Subjective:     Patient ID: Debra Nolan, female    DOB: August 18, 1970, 53 y.o.   MRN: [id number]  Chief Complaint  Patient presents with   Rash    Patient states that she has a rash under her breast around bra line. She states that it started last Tuesday , she was hot and the rash appeared later that day. She states that she is having some Chaffing.    Rash Patient is in today for acute visit.  Intertrigo Pt reported worsening intertrigo underneath bilateral breasts. She was given Mycolog back in December 2024 and not helping. This has been present since last Tuesday. Using ointment BID. Not helping. Starting to blister.  Review of Systems  Skin:  Positive for rash.  All other systems reviewed and are negative.      Objective:    BP 121/78   Pulse 80   Temp 97.8 F (36.6 C) (Oral)   Resp 18   Ht 5\' 10"  (1.778 m)   Wt (!) 321 lb (145.6 kg)   SpO2 98%   BMI 46.06 kg/m  BP Readings from Last 3 Encounters:  08/15/23 121/78  08/04/23 121/77  05/03/23 137/78      Physical Exam Vitals and nursing note reviewed.  Constitutional:      Appearance: Normal appearance. She is normal weight.  HENT:     Head: Normocephalic and atraumatic.     Right Ear: External ear normal.     Left Ear: External ear normal.     Nose: Nose normal.     Mouth/Throat:     Mouth: Mucous membranes are moist.     Pharynx: Oropharynx is clear.  Eyes:     Conjunctiva/sclera: Conjunctivae normal.     Pupils: Pupils are equal, round, and reactive to light.  Cardiovascular:     Rate and Rhythm: Normal rate.  Pulmonary:     Effort: Pulmonary effort is normal.  Skin:    General: Skin is warm.     Capillary Refill: Capillary refill takes less than 2 seconds.     Findings: Erythema and rash present.     Comments: Bilateral erythematous macules on breast skin folds  Neurological:     General: No focal deficit present.     Mental Status: She is alert and oriented to person,  place, and time. Mental status is at baseline.  Psychiatric:        Mood and Affect: Mood normal.        Behavior: Behavior normal.        Thought Content: Thought content normal.        Judgment: Judgment normal.   No results found for any visits on 08/15/23.      Assessment & Plan:   Problem List Items Addressed This Visit   None  Intertrigo -     Mupirocin; Apply 1 Application topically 2 (two) times daily as needed.  Dispense: 22 g; Refill: 5 -     Nystatin ; Apply 1 Application topically 2 (two) times daily as needed.  Dispense: 30 g; Refill: 5   Pt with intertrigo that's worsening. Treat with Mupirocin and Nystatin . Keep area dry, may use gauze and use cold showers/ice packs. No orders of the defined types were placed in this encounter.   No follow-ups on file.  Manette Section, MD

## 2023-09-27 ENCOUNTER — Ambulatory Visit (INDEPENDENT_AMBULATORY_CARE_PROVIDER_SITE_OTHER): Admitting: Family Medicine

## 2023-09-27 ENCOUNTER — Encounter: Payer: Self-pay | Admitting: Family Medicine

## 2023-09-27 VITALS — BP 104/69 | HR 70 | Temp 98.1°F | Resp 18 | Ht 70.0 in | Wt 322.0 lb

## 2023-09-27 DIAGNOSIS — J3089 Other allergic rhinitis: Secondary | ICD-10-CM

## 2023-09-27 MED ORDER — METHYLPREDNISOLONE ACETATE 80 MG/ML IJ SUSP
80.0000 mg | Freq: Once | INTRAMUSCULAR | Status: AC
  Administered 2023-09-27: 80 mg via INTRAMUSCULAR

## 2023-09-27 NOTE — Progress Notes (Signed)
 Acute Office Visit  Subjective:     Patient ID: Debra Nolan, female    DOB: Feb 03, 1971, 53 y.o.   MRN: [id number]  Chief Complaint  Patient presents with   Acute Visit    Patient states that her symptoms started Friday with sneezing, and by Saturday she was having left drainage, productive cough, and some sinus pressure in her head.     Sinusitis Associated symptoms include coughing and ear pain. Pertinent negatives include no sore throat.   Patient is in today for acute visit.  Pt reports sneezing started on Friday with coughing up mucus and left ear pain. She has been taking Keflex  that she had left over that she took Saturday and Sunday. She denies sore throat, nasal congestion, or facial pain/pressure. Denies fever or metallic taste in mouth.   Review of Systems  Constitutional:  Negative for fever.  HENT:  Positive for ear pain. Negative for sinus pain and sore throat.   Respiratory:  Positive for cough.   All other systems reviewed and are negative.       Objective:    BP 104/69   Pulse 70   Temp 98.1 F (36.7 C) (Oral)   Resp 18   Ht 5' 10 (1.778 m)   Wt (!) 322 lb (146.1 kg)   SpO2 96%   BMI 46.20 kg/m  BP Readings from Last 3 Encounters:  09/27/23 104/69  08/15/23 121/78  08/04/23 121/77      Physical Exam Vitals and nursing note reviewed.  Constitutional:      Appearance: Normal appearance. She is obese.  HENT:     Head: Normocephalic and atraumatic.     Right Ear: External ear normal.     Left Ear: External ear normal.     Nose: Nose normal.     Mouth/Throat:     Mouth: Mucous membranes are moist.     Pharynx: Oropharynx is clear.  Eyes:     Conjunctiva/sclera: Conjunctivae normal.     Pupils: Pupils are equal, round, and reactive to light.  Cardiovascular:     Rate and Rhythm: Normal rate and regular rhythm.     Pulses: Normal pulses.     Heart sounds: Normal heart sounds.  Pulmonary:     Effort: Pulmonary effort is normal.      Breath sounds: Normal breath sounds.  Skin:    General: Skin is warm.     Capillary Refill: Capillary refill takes less than 2 seconds.  Neurological:     General: No focal deficit present.     Mental Status: She is alert and oriented to person, place, and time. Mental status is at baseline.  Psychiatric:        Mood and Affect: Mood normal.        Behavior: Behavior normal.        Thought Content: Thought content normal.        Judgment: Judgment normal.     No results found for any visits on 09/27/23.      Assessment & Plan:   Problem List Items Addressed This Visit   None Visit Diagnoses       Allergic rhinitis due to other allergic trigger, unspecified seasonality    -  Primary   Relevant Medications   methylPREDNISolone  acetate (DEPO-MEDROL ) injection 80 mg       Meds ordered this encounter  Medications   methylPREDNISolone  acetate (DEPO-MEDROL ) injection 80 mg  Symptoms consistent with allergic rhinitis. Depo 80 mg IM  today. Continue symptomatic care.   No follow-ups on file.  Torrence CINDERELLA Barrier, MD

## 2023-09-29 ENCOUNTER — Other Ambulatory Visit: Payer: Self-pay | Admitting: Family Medicine

## 2023-09-29 DIAGNOSIS — Z1231 Encounter for screening mammogram for malignant neoplasm of breast: Secondary | ICD-10-CM

## 2023-10-06 ENCOUNTER — Ambulatory Visit

## 2023-10-06 DIAGNOSIS — Z1231 Encounter for screening mammogram for malignant neoplasm of breast: Secondary | ICD-10-CM | POA: Diagnosis not present

## 2023-10-10 ENCOUNTER — Ambulatory Visit: Payer: Self-pay | Admitting: Family Medicine

## 2023-10-22 ENCOUNTER — Other Ambulatory Visit: Payer: Self-pay | Admitting: Family Medicine

## 2023-10-22 DIAGNOSIS — M47816 Spondylosis without myelopathy or radiculopathy, lumbar region: Secondary | ICD-10-CM

## 2023-10-31 ENCOUNTER — Ambulatory Visit (INDEPENDENT_AMBULATORY_CARE_PROVIDER_SITE_OTHER): Payer: BC Managed Care – PPO | Admitting: Family Medicine

## 2023-10-31 VITALS — BP 121/79 | HR 68 | Temp 97.8°F | Resp 18 | Ht 70.0 in | Wt 325.3 lb

## 2023-10-31 DIAGNOSIS — I10 Essential (primary) hypertension: Secondary | ICD-10-CM | POA: Diagnosis not present

## 2023-10-31 DIAGNOSIS — R61 Generalized hyperhidrosis: Secondary | ICD-10-CM | POA: Diagnosis not present

## 2023-10-31 DIAGNOSIS — F411 Generalized anxiety disorder: Secondary | ICD-10-CM | POA: Diagnosis not present

## 2023-10-31 DIAGNOSIS — M47816 Spondylosis without myelopathy or radiculopathy, lumbar region: Secondary | ICD-10-CM

## 2023-10-31 DIAGNOSIS — Z1329 Encounter for screening for other suspected endocrine disorder: Secondary | ICD-10-CM

## 2023-10-31 MED ORDER — GABAPENTIN 600 MG PO TABS: 600.0000 mg | ORAL_TABLET | Freq: Every day | ORAL | 1 refills | Status: AC

## 2023-10-31 MED ORDER — CITALOPRAM HYDROBROMIDE 20 MG PO TABS: 20.0000 mg | ORAL_TABLET | Freq: Every day | ORAL | 1 refills | Status: AC

## 2023-10-31 MED ORDER — LISINOPRIL-HYDROCHLOROTHIAZIDE 20-12.5 MG PO TABS: 0.5000 | ORAL_TABLET | Freq: Every day | ORAL | 1 refills | Status: AC

## 2023-10-31 NOTE — Progress Notes (Signed)
 Established Patient Office Visit  Subjective   Patient ID: Debra Nolan, female    DOB: Jul 29, 1970  Age: 53 y.o. MRN: [id number]  Chief Complaint  Patient presents with   Follow-up    Patient is here for a 6 month follow up , patient would also like to discuss excessive sweating .     HPI  6 month follow up.  Pt reports excessive sweating for the last 10 years. Worse with anxiety. She says her hair gets soaked.  Pt on Zestoretic  20/12.5mg  1/2 tab po daily. Tolerating medicine. Denies chest pains or SOB.  Pt has lumbar spondylosis and taking Gabapentin  600mg  at bedtime. Needs this refilled.   Pt has hx of GAD. On Celexa  20 mg daily. Needs this refilled.    Review of Systems  Endo/Heme/Allergies:        Excessive sweating  All other systems reviewed and are negative.     Objective:     BP 121/79   Pulse 68   Temp 97.8 F (36.6 C) (Oral)   Resp 18   Ht 5' 10 (1.778 m)   Wt (!) 325 lb 4.8 oz (147.6 kg)   SpO2 99%   BMI 46.68 kg/m  BP Readings from Last 3 Encounters:  10/31/23 121/79  09/27/23 104/69  08/15/23 121/78      Physical Exam Vitals and nursing note reviewed.  Constitutional:      Appearance: Normal appearance. She is obese.  HENT:     Head: Normocephalic and atraumatic.     Right Ear: External ear normal.     Left Ear: External ear normal.     Nose: Nose normal.     Mouth/Throat:     Mouth: Mucous membranes are moist.     Pharynx: Oropharynx is clear.  Eyes:     Conjunctiva/sclera: Conjunctivae normal.     Pupils: Pupils are equal, round, and reactive to light.  Cardiovascular:     Rate and Rhythm: Normal rate and regular rhythm.     Pulses: Normal pulses.     Heart sounds: Normal heart sounds.  Pulmonary:     Effort: Pulmonary effort is normal.     Breath sounds: Normal breath sounds.  Skin:    General: Skin is warm.     Capillary Refill: Capillary refill takes less than 2 seconds.  Neurological:     General: No focal deficit  present.     Mental Status: She is alert and oriented to person, place, and time. Mental status is at baseline.  Psychiatric:        Mood and Affect: Mood normal.        Behavior: Behavior normal.        Thought Content: Thought content normal.        Judgment: Judgment normal.      No results found for any visits on 10/31/23.  Last thyroid functions No results found for: TSH, T3TOTAL, T4TOTAL, THYROIDAB    The 10-year ASCVD risk score (Arnett DK, et al., 2019) is: 1.8%    Assessment & Plan:   Problem List Items Addressed This Visit       Cardiovascular and Mediastinum   Hypertension   Relevant Medications   lisinopril -hydrochlorothiazide  (ZESTORETIC ) 20-12.5 MG tablet     Musculoskeletal and Integument   Lumbar spondylosis   Relevant Medications   gabapentin  (NEURONTIN ) 600 MG tablet     Other   Generalized anxiety disorder   Relevant Medications   citalopram  (CELEXA ) 20 MG tablet  Other Visit Diagnoses       Excessive sweating    -  Primary     Screening for thyroid disorder       Relevant Orders   TSH + free T4     Excessive sweating  Screening for thyroid disorder -     TSH + free T4  Primary hypertension -     Lisinopril -hydroCHLOROthiazide ; Take 0.5 tablets by mouth daily.  Dispense: 45 tablet; Refill: 1  Lumbar spondylosis -     Gabapentin ; Take 1 tablet (600 mg total) by mouth at bedtime.  Dispense: 90 tablet; Refill: 1  Generalized anxiety disorder -     Citalopram  Hydrobromide; Take 1 tablet (20 mg total) by mouth daily.  Dispense: 90 tablet; Refill: 1   Due to excessive sweating, will screen thyroid although pt reports worsening with anxiety. If thyroid normal, may increase Celexa .  Refilled Zestoretic . Blood pressure stable. Refilled Gabapentin  for back pain.    Return in about 6 months (around 05/03/2024) for Annual Physical.    Torrence CINDERELLA Barrier, MD

## 2023-11-01 ENCOUNTER — Ambulatory Visit: Payer: Self-pay | Admitting: Family Medicine

## 2023-11-01 LAB — TSH+FREE T4
Free T4: 1.17 ng/dL (ref 0.82–1.77)
TSH: 1.92 u[IU]/mL (ref 0.450–4.500)

## 2023-11-24 ENCOUNTER — Other Ambulatory Visit: Payer: Self-pay | Admitting: Family Medicine

## 2023-11-24 DIAGNOSIS — S161XXD Strain of muscle, fascia and tendon at neck level, subsequent encounter: Secondary | ICD-10-CM

## 2023-12-28 ENCOUNTER — Other Ambulatory Visit: Payer: Self-pay | Admitting: Family Medicine

## 2023-12-28 DIAGNOSIS — S161XXD Strain of muscle, fascia and tendon at neck level, subsequent encounter: Secondary | ICD-10-CM

## 2024-02-13 ENCOUNTER — Other Ambulatory Visit: Payer: Self-pay

## 2024-02-13 ENCOUNTER — Ambulatory Visit
Admission: EM | Admit: 2024-02-13 | Discharge: 2024-02-13 | Disposition: A | Discharge status: Home / Self Care | Attending: Family Medicine | Admitting: Family Medicine

## 2024-02-13 DIAGNOSIS — H6001 Abscess of right external ear: Secondary | ICD-10-CM | POA: Diagnosis not present

## 2024-02-13 MED ORDER — DOXYCYCLINE HYCLATE 100 MG PO CAPS: 100.0000 mg | ORAL_CAPSULE | Freq: Two times a day (BID) | ORAL | 0 refills | Status: AC

## 2024-02-13 NOTE — Discharge Instructions (Addendum)
 Patient take medication as directed with food to completion.  Encouraged to increase daily water intake to 64 ounces per day while taking this medication.  Advised if symptoms worsen and are unresolved please follow-up with your PCP, general surgery, dermatology or here for further evaluation.

## 2024-02-13 NOTE — ED Triage Notes (Signed)
 Right ear (outer ear) pain since last Wednesday. No fever. No otc meds.

## 2024-02-13 NOTE — ED Provider Notes (Signed)
 [name]    CSN: [id number] Arrival date & time: 02/13/24  0836      History   Chief Complaint Chief Complaint  Patient presents with   Ear Fullness    HPI Debra Nolan is a 53 y.o. female.   HPI 53 year old female presents with right ear outer ear pain for 5 days.  PMH significant for morbid/severe obesity, HTN, and OSA.  Past Medical History:  Diagnosis Date   Allergy    Seasonal   Anxiety    DDD (degenerative disc disease), lumbar    Hypertension    Seasonal allergies    Sleep apnea     Patient Active Problem List   Diagnosis Date Noted   Anxiety    DDD (degenerative disc disease), lumbar    Hypertension    Seasonal allergies    Chronic left hip pain 12/29/2021   OSA (obstructive sleep apnea) 11/26/2021   Morbid obesity (HCC) 06/19/2020   Generalized anxiety disorder 06/19/2020   Essential hypertension 06/19/2020   Artificial knee joint present 06/19/2020   Allergic rhinitis due to pollen 06/19/2020   Lumbar spondylosis 09/12/2018   Degenerative disc disease, lumbar 09/12/2018   Asthma 09/11/2018   Primary osteoarthritis of left knee 01/27/2018   Primary osteoarthritis of right knee 08/19/2017   Decreased functional activity tolerance 08/05/2017   Chronic pain of left knee 07/04/2017   Arthritis of midfoot 01/22/2016   Foot pain, left 01/22/2016   Pes planus of left foot 11/27/2015   S/P carpal tunnel release 10/31/2015   Bilateral carpal tunnel syndrome 02/23/2015    Past Surgical History:  Procedure Laterality Date   APPENDECTOMY  2018   FRACTURE SURGERY     Right foot   JOINT REPLACEMENT     Knees   right foot surgery     right knee surgery      OB History   No obstetric history on file.      Home Medications    Prior to Admission medications   Medication Sig Start Date End Date Taking? Authorizing Provider  doxycycline  (VIBRAMYCIN ) 100 MG capsule Take 1 capsule (100 mg total) by mouth 2 (two) times daily for 10  days. 02/13/24 02/23/24 Yes Teddy Sharper, FNP  albuterol  (VENTOLIN  HFA) 108 (90 Base) MCG/ACT inhaler Inhale 2 puffs into the lungs every 6 (six) hours as needed for wheezing or shortness of breath. 05/03/23   Colette Torrence GRADE, MD  baclofen  (LIORESAL ) 10 MG tablet Take 1 tablet by mouth three times daily as needed for muscle spasm 12/28/23   Booker Darice SAUNDERS, FNP  beclomethasone (QVAR REDIHALER) 80 MCG/ACT inhaler Inhale into the lungs. 01/13/18   [provider]  celecoxib  (CELEBREX ) 100 MG capsule TAKE 1 CAPSULE BY MOUTH ONCE DAILY AS NEEDED 10/24/23   Colette Torrence GRADE, MD  cholecalciferol (VITAMIN D3) 25 MCG (1000 UNIT) tablet Take 2,000 Units by mouth daily.    [provider]  citalopram  (CELEXA ) 20 MG tablet Take 1 tablet (20 mg total) by mouth daily. 10/31/23   Rucker, Alethea Y, MD  fluticasone (FLONASE) 50 MCG/ACT nasal spray Flonase Allergy Relief 50 mcg/actuation nasal spray,suspension  Inhale 2 sprays every day by intranasal route.    [provider]  gabapentin  (NEURONTIN ) 600 MG tablet Take 1 tablet (600 mg total) by mouth at bedtime. 10/31/23   Colette Torrence GRADE, MD  lisinopril -hydrochlorothiazide  (ZESTORETIC ) 20-12.5 MG tablet Take 0.5 tablets by mouth daily. 10/31/23   Colette Torrence GRADE, MD  Multiple Vitamin JACQUELYNN)  TABS Take 1 tablet by mouth every morning.    [provider]  mupirocin  ointment (BACTROBAN ) 2 % Apply 1 Application topically 2 (two) times daily as needed. 08/15/23   Colette Torrence GRADE, MD  nystatin  ointment (MYCOSTATIN ) Apply 1 Application topically 2 (two) times daily as needed. 08/15/23   Colette Torrence GRADE, MD  nystatin -triamcinolone  ointment (MYCOLOG) Apply 1 Application topically 2 (two) times daily as needed. 11/24/22   Colette Torrence GRADE, MD    Family History Family History  Problem Relation Age of Onset   Osteoarthritis Mother    Healthy Mother    Healthy Father    Healthy Sister     Social History Social History   Tobacco  Use   Smoking status: Never    Passive exposure: Never   Smokeless tobacco: Never  Vaping Use   Vaping status: Never Used  Substance Use Topics   Alcohol use: Yes    Comment: occ   Drug use: Never     Allergies   Hydrocodone    Review of Systems Review of Systems  HENT:         Abscess of her right earlobe x 5 days  All other systems reviewed and are negative.    Physical Exam Triage Vital Signs ED Triage Vitals  Encounter Vitals Group     BP 02/13/24 0842 138/83     Girls Systolic BP Percentile --      Girls Diastolic BP Percentile --      Boys Systolic BP Percentile --      Boys Diastolic BP Percentile --      Pulse Rate 02/13/24 0842 82     Resp 02/13/24 0842 16     Temp 02/13/24 0842 98.2 F (36.8 C)     Temp src --      SpO2 02/13/24 0842 95 %     Weight --      Height --      Head Circumference --      Peak Flow --      Pain Score 02/13/24 0845 1     Pain Loc --      Pain Education --      Exclude from Growth Chart --    No data found.  Updated Vital Signs BP 138/83   Pulse 82   Temp 98.2 F (36.8 C)   Resp 16   Ht 5' 10 (1.778 m)   Wt (!) 320 lb (145.2 kg)   LMP 11/07/2023 (Approximate)   SpO2 95%   BMI 45.92 kg/m    Physical Exam Vitals and nursing note reviewed.  Constitutional:      Appearance: Normal appearance. She is obese.  HENT:     Head: Normocephalic and atraumatic.     Right Ear: External ear normal.     Left Ear: External ear normal.     Mouth/Throat:     Mouth: Mucous membranes are moist.     Pharynx: Oropharynx is clear.  Eyes:     Extraocular Movements: Extraocular movements intact.     Conjunctiva/sclera: Conjunctivae normal.     Pupils: Pupils are equal, round, and reactive to light.  Cardiovascular:     Rate and Rhythm: Normal rate and regular rhythm.     Pulses: Normal pulses.     Heart sounds: Normal heart sounds.  Pulmonary:     Effort: Pulmonary effort is normal.     Breath sounds: Normal breath sounds.  No wheezing, rhonchi or rales.  Musculoskeletal:  General: Normal range of motion.  Skin:    General: Skin is warm and dry.     Comments: Right earlobe:~0.5 cm x 0.5 cm circular shaped erythematous abscess of earlobe noted please see image below  Neurological:     General: No focal deficit present.     Mental Status: She is alert and oriented to person, place, and time. Mental status is at baseline.  Psychiatric:        Mood and Affect: Mood normal.        Behavior: Behavior normal.      UC Treatments / Results  Labs (all labs ordered are listed, but only abnormal results are displayed) Labs Reviewed - No data to display  EKG   Radiology No results found.  Procedures Procedures (including critical care time)  Medications Ordered in UC Medications - No data to display  Initial Impression / Assessment and Plan / UC Course  I have reviewed the triage vital signs and the nursing notes.  Pertinent labs & imaging results that were available during my care of the patient were reviewed by me and considered in my medical decision making (see chart for details).     MDM: 1.  Abscess of right earlobe-Rx'd doxycycline  100 mg capsule: Take 1 capsule twice daily x 10 days. Patient take medication as directed with food to completion.  Encouraged to increase daily water intake to 64 ounces per day while taking this medication.  Advised if symptoms worsen and are unresolved please follow-up with your PCP, general surgery, dermatology or here for further evaluation.  Patient discharged home, hemodynamically stable. Final Clinical Impressions(s) / UC Diagnoses   Final diagnoses:  Abscess of right earlobe     Discharge Instructions      Patient take medication as directed with food to completion.  Encouraged to increase daily water intake to 64 ounces per day while taking this medication.  Advised if symptoms worsen and are unresolved please follow-up with your PCP, general  surgery, dermatology or here for further evaluation.     ED Prescriptions     Medication Sig Dispense Auth. Provider   doxycycline  (VIBRAMYCIN ) 100 MG capsule Take 1 capsule (100 mg total) by mouth 2 (two) times daily for 10 days. 20 capsule Fraser Busche, FNP      PDMP not reviewed this encounter.   Teddy Sharper, FNP 02/13/24 [phone]

## 2024-02-20 ENCOUNTER — Other Ambulatory Visit: Payer: Self-pay | Admitting: Family Medicine

## 2024-02-20 DIAGNOSIS — S161XXD Strain of muscle, fascia and tendon at neck level, subsequent encounter: Secondary | ICD-10-CM

## 2024-02-26 ENCOUNTER — Ambulatory Visit
Admission: RE | Admit: 2024-02-26 | Discharge: 2024-02-26 | Disposition: A | Discharge status: Home / Self Care | Source: Ambulatory Visit | Attending: Family Medicine | Admitting: Family Medicine

## 2024-02-26 VITALS — BP 141/74 | HR 68 | Temp 98.0°F | Resp 18 | Ht 70.0 in | Wt 320.0 lb

## 2024-02-26 DIAGNOSIS — R0981 Nasal congestion: Secondary | ICD-10-CM | POA: Diagnosis not present

## 2024-02-26 MED ORDER — PREDNISONE 10 MG (21) PO TBPK: ORAL_TABLET | Freq: Every day | ORAL | 0 refills | Status: AC

## 2024-02-26 NOTE — Discharge Instructions (Addendum)
 Advised patient take medication as directed with food to completion.  Encouraged increase daily water intake to 64 ounces per day while taking this medication.  Advised if symptoms worsen and/or unresolved please follow-up with your PCP or here for further evaluation.

## 2024-02-26 NOTE — ED Provider Notes (Signed)
 [facility]    CSN: [id number] Arrival date & time: 02/26/24  1322      History   Chief Complaint Chief Complaint  Patient presents with   Nasal Congestion    Entered by patient    HPI Debra Nolan is a 53 y.o. female.   HPI 53 year old female presents with nasal congestion for 3 days. PMH significant for morbid/severe obesity, HTN, and OSA.  Past Medical History:  Diagnosis Date   Allergy    Seasonal   Anxiety    DDD (degenerative disc disease), lumbar    Hypertension    Seasonal allergies    Sleep apnea     Patient Active Problem List   Diagnosis Date Noted   Anxiety    DDD (degenerative disc disease), lumbar    Hypertension    Seasonal allergies    Chronic left hip pain 12/29/2021   OSA (obstructive sleep apnea) 11/26/2021   Morbid obesity (HCC) 06/19/2020   Generalized anxiety disorder 06/19/2020   Essential hypertension 06/19/2020   Artificial knee joint present 06/19/2020   Allergic rhinitis due to pollen 06/19/2020   Lumbar spondylosis 09/12/2018   Degenerative disc disease, lumbar 09/12/2018   Asthma 09/11/2018   Primary osteoarthritis of left knee 01/27/2018   Primary osteoarthritis of right knee 08/19/2017   Decreased functional activity tolerance 08/05/2017   Chronic pain of left knee 07/04/2017   Arthritis of midfoot 01/22/2016   Foot pain, left 01/22/2016   Pes planus of left foot 11/27/2015   S/P carpal tunnel release 10/31/2015   Bilateral carpal tunnel syndrome 02/23/2015    Past Surgical History:  Procedure Laterality Date   APPENDECTOMY  2018   FRACTURE SURGERY     Right foot   JOINT REPLACEMENT     Knees   right foot surgery     right knee surgery      OB History   No obstetric history on file.      Home Medications    Prior to Admission medications  Medication Sig Start Date End Date Taking? Authorizing Provider  albuterol  (VENTOLIN  HFA) 108 (90 Base) MCG/ACT inhaler Inhale 2 puffs into the lungs every  6 (six) hours as needed for wheezing or shortness of breath. 05/03/23  Yes Rucker, Torrence GRADE, MD  baclofen  (LIORESAL ) 10 MG tablet Take 1 tablet by mouth three times daily as needed for muscle spasm 02/21/24  Yes Booker Darice SAUNDERS, FNP  beclomethasone (QVAR REDIHALER) 80 MCG/ACT inhaler Inhale into the lungs. 01/13/18  Yes [provider]  celecoxib  (CELEBREX ) 100 MG capsule TAKE 1 CAPSULE BY MOUTH ONCE DAILY AS NEEDED 10/24/23  Yes Rucker, Torrence GRADE, MD  cholecalciferol (VITAMIN D3) 25 MCG (1000 UNIT) tablet Take 2,000 Units by mouth daily.   Yes [provider]  citalopram  (CELEXA ) 20 MG tablet Take 1 tablet (20 mg total) by mouth daily. 10/31/23  Yes Rucker, Torrence GRADE, MD  fluticasone (FLONASE) 50 MCG/ACT nasal spray Flonase Allergy Relief 50 mcg/actuation nasal spray,suspension  Inhale 2 sprays every day by intranasal route.   Yes [provider]  gabapentin  (NEURONTIN ) 600 MG tablet Take 1 tablet (600 mg total) by mouth at bedtime. 10/31/23  Yes Rucker, Torrence GRADE, MD  lisinopril -hydrochlorothiazide  (ZESTORETIC ) 20-12.5 MG tablet Take 0.5 tablets by mouth daily. 10/31/23  Yes Rucker, Torrence GRADE, MD  Multiple Vitamin (THERA) TABS Take 1 tablet by mouth every morning.   Yes [provider]  mupirocin  ointment (BACTROBAN ) 2 % Apply 1 Application topically 2 (  two) times daily as needed. 08/15/23  Yes Rucker, Torrence GRADE, MD  nystatin  ointment (MYCOSTATIN ) Apply 1 Application topically 2 (two) times daily as needed. 08/15/23  Yes Rucker, Torrence GRADE, MD  nystatin -triamcinolone  ointment (MYCOLOG) Apply 1 Application topically 2 (two) times daily as needed. 11/24/22  Yes Rucker, Torrence GRADE, MD  predniSONE  (STERAPRED UNI-PAK 21 TAB) 10 MG (21) TBPK tablet Take by mouth daily. Take 6 tabs by mouth daily  for 2 days, then 5 tabs for 2 days, then 4 tabs for 2 days, then 3 tabs for 2 days, 2 tabs for 2 days, then 1 tab by mouth daily for 2 days 02/26/24  Yes Teddy Sharper, FNP    Family  History Family History  Problem Relation Age of Onset   Osteoarthritis Mother    Healthy Mother    Healthy Father    Healthy Sister     Social History Social History[1]   Allergies   Hydrocodone    Review of Systems Review of Systems   Physical Exam Triage Vital Signs ED Triage Vitals  Encounter Vitals Group     BP      Girls Systolic BP Percentile      Girls Diastolic BP Percentile      Boys Systolic BP Percentile      Boys Diastolic BP Percentile      Pulse      Resp      Temp      Temp src      SpO2      Weight      Height      Head Circumference      Peak Flow      Pain Score      Pain Loc      Pain Education      Exclude from Growth Chart    No data found.  Updated Vital Signs BP (!) 141/74 (BP Location: Right Arm)   Pulse 68   Temp 98 F (36.7 C) (Oral)   Resp 18   Ht 5' 10 (1.778 m)   Wt (!) 320 lb (145.2 kg)   LMP 11/07/2023 (Approximate)   SpO2 93%   BMI 45.92 kg/m   Visual Acuity Right Eye Distance:   Left Eye Distance:   Bilateral Distance:    Right Eye Near:   Left Eye Near:    Bilateral Near:     Physical Exam Vitals and nursing note reviewed.  Constitutional:      General: She is not in acute distress.    Appearance: Normal appearance. She is obese. She is not ill-appearing or toxic-appearing.  HENT:     Head: Normocephalic and atraumatic.     Right Ear: Tympanic membrane and external ear normal.     Left Ear: Tympanic membrane and external ear normal.     Ears:     Comments: Moderate eustachian tube dysfunction noted bilaterally    Nose:     Right Sinus: Maxillary sinus tenderness present.     Left Sinus: Maxillary sinus tenderness present.     Comments: Turbinates are edematous    Mouth/Throat:     Mouth: Mucous membranes are moist.     Pharynx: Oropharynx is clear.  Eyes:     Extraocular Movements: Extraocular movements intact.     Conjunctiva/sclera: Conjunctivae normal.     Pupils: Pupils are equal, round, and  reactive to light.  Cardiovascular:     Rate and Rhythm: Normal rate and regular rhythm.  Heart sounds: Normal heart sounds.  Pulmonary:     Effort: Pulmonary effort is normal.     Breath sounds: Normal breath sounds. No wheezing, rhonchi or rales.  Musculoskeletal:        General: Normal range of motion.     Cervical back: Normal range of motion and neck supple.  Skin:    General: Skin is warm and dry.  Neurological:     General: No focal deficit present.     Mental Status: She is alert and oriented to person, place, and time. Mental status is at baseline.  Psychiatric:        Mood and Affect: Mood normal.        Behavior: Behavior normal.        Thought Content: Thought content normal.      UC Treatments / Results  Labs (all labs ordered are listed, but only abnormal results are displayed) Labs Reviewed - No data to display  EKG   Radiology No results found.  Procedures Procedures (including critical care time)  Medications Ordered in UC Medications - No data to display  Initial Impression / Assessment and Plan / UC Course  I have reviewed the triage vital signs and the nursing notes.  Pertinent labs & imaging results that were available during my care of the patient were reviewed by me and considered in my medical decision making (see chart for details).     MDM: 1.  Nasal congestion-Rx'd Sterapred Unipak (42 tab 10 mg taper) Advised patient take medication as directed with food to completion.  Encouraged increase daily water intake to 64 ounces per day while taking this medication.  Advised if symptoms worsen and/or unresolved please follow-up with your PCP or here for further evaluation.  Patient discharged home, hemodynamically stable. Final Clinical Impressions(s) / UC Diagnoses   Final diagnoses:  Nasal congestion     Discharge Instructions      Advised patient take medication as directed with food to completion.  Encouraged increase daily water  intake to 64 ounces per day while taking this medication.  Advised if symptoms worsen and/or unresolved please follow-up with your PCP or here for further evaluation.     ED Prescriptions     Medication Sig Dispense Auth. Provider   predniSONE  (STERAPRED UNI-PAK 21 TAB) 10 MG (21) TBPK tablet Take by mouth daily. Take 6 tabs by mouth daily  for 2 days, then 5 tabs for 2 days, then 4 tabs for 2 days, then 3 tabs for 2 days, 2 tabs for 2 days, then 1 tab by mouth daily for 2 days 42 tablet Teddy Sharper, FNP      PDMP not reviewed this encounter.    [1]  Social History Tobacco Use   Smoking status: Never    Passive exposure: Never   Smokeless tobacco: Never  Vaping Use   Vaping status: Never Used  Substance Use Topics   Alcohol use: Yes    Comment: occ   Drug use: Never     Teddy Sharper, FNP 02/26/24 1423  "

## 2024-02-26 NOTE — ED Triage Notes (Signed)
 Patient c/o sinus pain and pressure, nasal drainage, sore throat x 3 days.  Afebrile.  Patient has taken Tea w/honey.  Patient did have a tele-health visit yesterday and prescribed Ipratropium, Flonase and Benzonatate  100mg  w/o much relief.

## 2024-03-06 DIAGNOSIS — H6191 Disorder of right external ear, unspecified: Secondary | ICD-10-CM | POA: Diagnosis not present

## 2024-03-28 ENCOUNTER — Other Ambulatory Visit: Payer: Self-pay | Admitting: Family Medicine

## 2024-03-28 DIAGNOSIS — S161XXD Strain of muscle, fascia and tendon at neck level, subsequent encounter: Secondary | ICD-10-CM

## 2024-05-03 ENCOUNTER — Encounter: Admitting: Family Medicine
